# Patient Record
Sex: Female | Born: 1950 | Race: White | Hispanic: No | Marital: Married | Smoking: Never smoker
Health system: Southern US, Community
[De-identification: ages and names within clinical notes are randomized; demographics above are authoritative.]

## PROBLEM LIST (undated history)

## (undated) DIAGNOSIS — R519 Headache, unspecified: Secondary | ICD-10-CM

## (undated) DIAGNOSIS — M1990 Unspecified osteoarthritis, unspecified site: Secondary | ICD-10-CM

## (undated) DIAGNOSIS — I10 Essential (primary) hypertension: Secondary | ICD-10-CM

## (undated) DIAGNOSIS — J189 Pneumonia, unspecified organism: Secondary | ICD-10-CM

## (undated) DIAGNOSIS — N200 Calculus of kidney: Secondary | ICD-10-CM

## (undated) DIAGNOSIS — Z87442 Personal history of urinary calculi: Secondary | ICD-10-CM

## (undated) HISTORY — PX: DILATION AND CURETTAGE OF UTERUS: SHX78

## (undated) HISTORY — PX: OTHER SURGICAL HISTORY: SHX169

## (undated) HISTORY — PX: GANGLION CYST EXCISION: SHX1691

## (undated) HISTORY — PX: TUBAL LIGATION: SHX77

---

## 2015-03-17 DIAGNOSIS — R42 Dizziness and giddiness: Secondary | ICD-10-CM | POA: Insufficient documentation

## 2016-04-02 DIAGNOSIS — Z8619 Personal history of other infectious and parasitic diseases: Secondary | ICD-10-CM | POA: Insufficient documentation

## 2018-08-10 ENCOUNTER — Encounter: Payer: Self-pay | Admitting: Emergency Medicine

## 2018-08-10 ENCOUNTER — Ambulatory Visit
Admission: EM | Admit: 2018-08-10 | Discharge: 2018-08-10 | Disposition: A | Discharge status: Home / Self Care | Payer: Medicare Other | Attending: Urgent Care | Admitting: Urgent Care

## 2018-08-10 ENCOUNTER — Other Ambulatory Visit: Payer: Self-pay

## 2018-08-10 DIAGNOSIS — N200 Calculus of kidney: Secondary | ICD-10-CM

## 2018-08-10 DIAGNOSIS — N390 Urinary tract infection, site not specified: Secondary | ICD-10-CM | POA: Diagnosis not present

## 2018-08-10 HISTORY — DX: Essential (primary) hypertension: I10

## 2018-08-10 LAB — URINALYSIS, COMPLETE (UACMP) WITH MICROSCOPIC
Bacteria, UA: NONE SEEN
Bilirubin Urine: NEGATIVE
Glucose, UA: NEGATIVE mg/dL
Ketones, ur: NEGATIVE mg/dL
Nitrite: NEGATIVE
Specific Gravity, Urine: 1.025 (ref 1.005–1.030)
pH: 5.5 (ref 5.0–8.0)

## 2018-08-10 MED ORDER — OXYCODONE-ACETAMINOPHEN 5-325 MG PO TABS: 1.0000 | ORAL_TABLET | Freq: Three times a day (TID) | ORAL | 0 refills | Status: DC | PRN

## 2018-08-10 MED ORDER — NITROFURANTOIN MONOHYD MACRO 100 MG PO CAPS: 100.0000 mg | ORAL_CAPSULE | Freq: Two times a day (BID) | ORAL | 0 refills | Status: DC

## 2018-08-10 NOTE — ED Provider Notes (Signed)
[place], [place]   Name: Amber CourtConnie Perman DOB: 1950-12-30 MRN: [id number] CSN: [id number] PCP: Patient, No Pcp Per  Arrival date and time:  08/10/18 1102  Chief Complaint:  Back Pain and Abdominal Pain   NOTE: Prior to seeing the patient today, I have reviewed the triage nursing documentation and vital signs. Clinical staff has updated patient's PMH/PSHx, current medication list, and drug allergies/intolerances to ensure comprehensive history available to assist in medical decision making.   History:   HPI: Amber Holmes is a 68 y.o. female who presents today with complaints of pain in her RIGHT lower back with associated dysuria. Symptoms started on Wednesday (08/06/2018). PMH (+) for recurrent urolithiasis. Patient presents to the clinic today with a urine cup that contains multiple stones. Patient advising that she passed a large stone on Thursday morning. Despite passing the stone, patient has had continued pain in her back, as well as dysuria and frequency. She has not appreciated any gross hematuria. She denies any associated fevers. Patient noting that her pain worsens significantly at night and has affected her sleep quality. She has been taking a retained supply of expired Percocet from 2014, which have improved her symptoms. She presented today requesting additional tablets. Of note, patient advising that she was followed by urology in the past, however her provider either retired or left the practice. She has not established care with another local provider for ongoing management of her kidney stone disease.    Past Medical History:  Diagnosis Date  . Hypertension   . Thyroid disease     History reviewed. No pertinent surgical history.  History reviewed. No pertinent family history.  Social History   Tobacco Use  . Smoking status: Never Smoker  . Smokeless tobacco: Never Used  Substance Use Topics  . Alcohol use: Never    Frequency: Never  . Drug use: Never    There are  no active problems to display for this patient.   Home Medications:    Current Meds  Medication Sig  . amLODipine (NORVASC) 5 MG tablet Take 5 mg by mouth daily.  Marland Kitchen. aspirin EC 81 MG tablet Take 81 mg by mouth daily.  . Vitamin D, Ergocalciferol, (DRISDOL) 1.25 MG (50000 UT) CAPS capsule Take 50,000 Units by mouth every 7 (seven) days.  . [DISCONTINUED] oxyCODONE-acetaminophen (PERCOCET/ROXICET) 5-325 MG tablet Take 1 tablet by mouth every 6 (six) hours as needed for severe pain.    Allergies:   Penicillins  Review of Systems (ROS): Review of Systems  Constitutional: Negative for chills and fever.  HENT: Negative.   Respiratory: Negative for cough and shortness of breath.   Cardiovascular: Negative for chest pain and palpitations.  Gastrointestinal: Negative for abdominal pain, diarrhea, nausea and vomiting.  Endocrine:       PMH (+) for HYPOthyroidism - on levothyroxine  Genitourinary: Positive for dysuria, flank pain and frequency. Negative for hematuria, pelvic pain, urgency, vaginal bleeding, vaginal discharge and vaginal pain.  Musculoskeletal: Positive for back pain. Negative for myalgias and neck pain.  Skin: Negative for color change, pallor and rash.  Neurological: Negative for dizziness, syncope, weakness and headaches.     Vital Signs: Today's Vitals   08/10/18 1148 08/10/18 1154 08/10/18 1237  BP:  (!) 167/84   Pulse:  77   Resp:  16   Temp:  98.1 F (36.7 C)   TempSrc:  Oral   SpO2:  97%   Weight: 203 lb (92.1 kg)    Height: 5\' 2"  (1.575 m)  PainSc: 5   0-No pain    Physical Exam: Physical Exam  Constitutional: She is oriented to person, place, and time and well-developed, well-nourished, and in no distress.  HENT:  Head: Normocephalic and atraumatic.  Mouth/Throat: Mucous membranes are normal.  Eyes: Pupils are equal, round, and reactive to light. EOM are normal.  Cardiovascular: Normal rate, regular rhythm, normal heart sounds and intact distal  pulses. Exam reveals no gallop and no friction rub.  No murmur heard. Pulmonary/Chest: Effort normal and breath sounds normal. No respiratory distress. She has no wheezes. She has no rales.  Abdominal: Soft. Normal appearance and bowel sounds are normal. There is no hepatosplenomegaly. There is no abdominal tenderness. There is CVA tenderness (slight on the RIGHT).  Neurological: She is alert and oriented to person, place, and time. Gait normal. GCS score is 15.  Skin: Skin is warm and dry. No rash noted.  Psychiatric: Mood, memory, affect and judgment normal.  Nursing note and vitals reviewed.   Urgent Care Treatments / Results:   LABS: PLEASE NOTE: all labs that were ordered this encounter are listed, however only abnormal results are displayed. Labs Reviewed  URINALYSIS, COMPLETE (UACMP) WITH MICROSCOPIC - Abnormal; Notable for the following components:      Result Value   Hgb urine dipstick MODERATE (*)    Protein, ur TRACE (*)    Leukocytes,Ua TRACE (*)    All other components within normal limits  URINE CULTURE    EKG: -None  RADIOLOGY: No results found.  PROCEDURES: Procedures  MEDICATIONS RECEIVED THIS VISIT: Medications - No data to display  PERTINENT CLINICAL COURSE NOTES/UPDATES:   Initial Impression / Assessment and Plan / Urgent Care Course:  Pertinent labs & imaging results that were available during my care of the patient were personally reviewed by me and considered in my medical decision making (see lab/imaging section of note for values and interpretations).  Amber Holmes is a 68 y.o. female who [facility] with complaints of Back Pain and Abdominal Pain  Patient overall well appearing and in no acute distress today in clinic. Exam reveals slight CVAT on the RIGHT. Presenting symptoms since Wednesday. Patient passed a stone on Thursday morning. No nausea, vomiting, fever, or chills. UA reveals  trace leukocytes and blood; reflex  culture sent. Patient voiding normally. Given symptoms, there is concern for urinary tract infection causing continued symptoms. 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 Will cover patient with a 5 day course of nitrofurantoin. Patient encouraged to complete the entire course of antibiotics even if she begins to feel better. She was advised that if culture demonstrates resistance to the prescribed antibiotic, she will be contacted and advised of the need to change the antibiotic being used to treat her infection. Patient encouraged to increase her fluid intake as much as possible. Discussed that water is always best to flush the urinary tract. She was advised to avoid caffeine containing fluids until her infections clears, as caffeine can cause her to experience painful bladder spasms. Will refill a short supply of Percocet to help with continued pain control.   Patient needs to be seen for further evaluation by urology Name and office contact information provided on today's AVS for Michiel CowboyShannon McGowan, PA. Patient advised the she will need to contact the office to schedule an appointment to be seen.   I have reviewed the follow up and strict  return precautions for any new or worsening symptoms. Patient is aware of symptoms that would be deemed urgent/emergent, and would thus require further evaluation either here or in the emergency department. At the time of discharge, she verbalized understanding and consent with the discharge plan as it was reviewed with her. All questions were fielded by provider and/or clinic staff prior to patient discharge.    Final Clinical Impressions / Urgent Care Diagnoses:   Final diagnoses:  Kidney stones  Urinary tract infection without hematuria, site unspecified    New Prescriptions:  [place] Controlled Substance Registry consulted? Yes, I have consulted the [place] Controlled Substances Registry for this patient, and  feel the risk/benefit ratio today is favorable for proceeding with this prescription for a controlled substance.  Meds ordered this encounter  Medications  . oxyCODONE-acetaminophen (PERCOCET/ROXICET) 5-325 MG tablet    Sig: Take 1 tablet by mouth every 8 (eight) hours as needed for severe pain.    Dispense:  12 tablet    Refill:  0  . nitrofurantoin, macrocrystal-monohydrate, (MACROBID) 100 MG capsule    Sig: Take 1 capsule (100 mg total) by mouth 2 (two) times daily.    Dispense:  10 capsule    Refill:  0   . Discussed use of controlled substance medication to treat her acute pain.  o Reviewed [place] STOP Act regulations regarding the prescription of controlled substances; supply will be limited to no more than 5 days.  o She was made aware that needs beyond the limited supply today will require another clinic visit, or evaluation by her PCP for re-evaluation of condition that may/may not warrant further therapy.  o Advised that this clinic does not refill controlled substances over the phone without face to face evaluation.  . Safety precautions reviewed.  o Patient educated that medications should not be bitten, chewed, or crushed.  o Patient verbalized understanding that medications should not be sold or shared, taken with alcohol, and should not be used while working or driving.  o She has been made aware of the side effects associated with the use of this particular medication. o Patient understands that this medication can cause CNS depression, increase her risk of falls, and even lead to overdose that may result in death, if used outside of the parameters that she and I discussed.  With all of this in mind, she knowingly accepts the risks and responsibilities associated with intended course of treatment, and elects to responsibly proceed as discussed.  Recommended Follow up Care:  Patient encouraged to follow up with the following provider within the specified time frame, or sooner as  dictated by the severity of her symptoms. As always, she was instructed that for any urgent/emergent care needs, she should seek care either here or in the emergency department for more immediate evaluation.  Follow-up Information    Call  Zara Council A, PA-C.   Specialties: Urology, Radiology Why: Need follow up appointment for recurrent kidney stones Contact information: [place] [phone]         NOTE: This note was prepared using Dragon dictation software along with smaller phrase technology. Despite my best ability to proofread, there is the potential that transcriptional errors may still occur from this process, and are completely unintentional.     Karen Kitchens, NP 08/10/18 1825

## 2018-08-10 NOTE — ED Triage Notes (Signed)
Patient states that she pass a kidney stone on Thursday.  Patient reports lower back and abdominal pain since Wed.  Patient denies fevers.

## 2018-08-10 NOTE — Discharge Instructions (Addendum)
It was very nice seeing you today in clinic. Thank you for entrusting me with your care.   As discussed, your urine is POSITIVE for infection. Will approach treatment as follows:  Prescription has been sent to your pharmacy for antibiotics.  Please pick up and take as directed. FINISH the entire course of medication even if you are feeling better.  A culture will be sent on your provided sample. If it comes back resistant to what I have prescribed you, someone will call you and let you know that we will need to change antibiotics. Increase fluid intake as much as possible to flush your urinary tract.  Water is always the best.  Avoid caffeine until your infection clears up, as it can contribute to painful bladder spasms.  May use Azo products (over the counter) to help with pain/discomfort.   Make arrangements to follow up with urology. I have given you ne name of an excellent local provider. Please call them to make an appointment. If your symptoms/condition worsens, please seek follow up care either here or in the ER. Please remember, our [facility] providers are "right here with you" when you need [place].   Again, it was my pleasure to take care of you today. Thank you for choosing our clinic. I hope that you start to feel better quickly.   Honor Loh, MSN, APRN, FNP-C, CEN Advanced Practice Provider [facility]

## 2018-08-11 ENCOUNTER — Other Ambulatory Visit: Payer: Self-pay

## 2018-08-11 ENCOUNTER — Emergency Department: Payer: Medicare Other

## 2018-08-11 ENCOUNTER — Emergency Department
Admission: EM | Admit: 2018-08-11 | Discharge: 2018-08-12 | Disposition: A | Discharge status: Home / Self Care | Payer: Medicare Other | Attending: Emergency Medicine | Admitting: Emergency Medicine

## 2018-08-11 DIAGNOSIS — N200 Calculus of kidney: Secondary | ICD-10-CM | POA: Insufficient documentation

## 2018-08-11 DIAGNOSIS — Z7982 Long term (current) use of aspirin: Secondary | ICD-10-CM | POA: Diagnosis not present

## 2018-08-11 DIAGNOSIS — N201 Calculus of ureter: Secondary | ICD-10-CM | POA: Diagnosis not present

## 2018-08-11 DIAGNOSIS — I10 Essential (primary) hypertension: Secondary | ICD-10-CM | POA: Diagnosis not present

## 2018-08-11 DIAGNOSIS — Z79899 Other long term (current) drug therapy: Secondary | ICD-10-CM | POA: Diagnosis not present

## 2018-08-11 DIAGNOSIS — R1030 Lower abdominal pain, unspecified: Secondary | ICD-10-CM | POA: Diagnosis present

## 2018-08-11 HISTORY — DX: Calculus of kidney: N20.0

## 2018-08-11 LAB — URINALYSIS, COMPLETE (UACMP) WITH MICROSCOPIC
Bacteria, UA: NONE SEEN
Bilirubin Urine: NEGATIVE
Glucose, UA: NEGATIVE mg/dL
Hgb urine dipstick: NEGATIVE
Ketones, ur: 20 mg/dL — AB
Leukocytes,Ua: NEGATIVE
Nitrite: NEGATIVE
Protein, ur: NEGATIVE mg/dL
Specific Gravity, Urine: 1.017 (ref 1.005–1.030)
pH: 6 (ref 5.0–8.0)

## 2018-08-11 LAB — CBC
HCT: 42.2 % (ref 36.0–46.0)
Hemoglobin: 14 g/dL (ref 12.0–15.0)
MCH: 29 pg (ref 26.0–34.0)
MCHC: 33.2 g/dL (ref 30.0–36.0)
MCV: 87.6 fL (ref 80.0–100.0)
Platelets: 167 10*3/uL (ref 150–400)
RBC: 4.82 MIL/uL (ref 3.87–5.11)
RDW: 12.7 % (ref 11.5–15.5)
WBC: 7.1 10*3/uL (ref 4.0–10.5)
nRBC: 0 % (ref 0.0–0.2)

## 2018-08-11 LAB — URINE CULTURE: Culture: <10000 — AB

## 2018-08-11 LAB — BASIC METABOLIC PANEL
Anion gap: 9 (ref 5–15)
BUN: 14 mg/dL (ref 8–23)
CO2: 25 mmol/L (ref 22–32)
Calcium: 9.7 mg/dL (ref 8.9–10.3)
Chloride: 103 mmol/L (ref 98–111)
Creatinine, Ser: 0.86 mg/dL (ref 0.44–1.00)
GFR calc Af Amer: >60 mL/min (ref >60)
GFR calc non Af Amer: >60 mL/min (ref >60)
Glucose, Bld: 120 mg/dL — ABNORMAL HIGH (ref 70–99)
Potassium: 4 mmol/L (ref 3.5–5.1)
Sodium: 137 mmol/L (ref 135–145)

## 2018-08-11 MED ORDER — SODIUM CHLORIDE 0.9 % IV BOLUS
1000.0000 mL | Freq: Once | INTRAVENOUS | Status: AC
  Administered 2018-08-11: 1000 mL via INTRAVENOUS

## 2018-08-11 MED ORDER — MORPHINE SULFATE (PF) 4 MG/ML IV SOLN
4.0000 mg | Freq: Once | INTRAVENOUS | Status: AC
  Administered 2018-08-11: 4 mg via INTRAVENOUS
  Filled 2018-08-11: qty 1

## 2018-08-11 NOTE — ED Triage Notes (Signed)
Pt seen at urgent care yesterday, dx with uti, and kidney stones. Pt given nitrofurantion and oxycodone. Pt says she is still in pain. Pt reports some nausea and not sleeping at night. Pt says she has a hx of kidney stones. Pt reports she passed the stone last week but is still hurting.

## 2018-08-12 ENCOUNTER — Telehealth: Payer: Self-pay | Admitting: Urology

## 2018-08-12 MED ORDER — TAMSULOSIN HCL 0.4 MG PO CAPS: 0.4000 mg | ORAL_CAPSULE | Freq: Every day | ORAL | 0 refills | Status: DC

## 2018-08-12 MED ORDER — ONDANSETRON HCL 4 MG PO TABS: 4.0000 mg | ORAL_TABLET | Freq: Every day | ORAL | 0 refills | Status: DC | PRN

## 2018-08-12 MED ORDER — ONDANSETRON 4 MG PO TBDP
4.0000 mg | ORAL_TABLET | Freq: Once | ORAL | Status: AC
  Administered 2018-08-12: 02:00:00 4 mg via ORAL

## 2018-08-12 MED ORDER — HYDROMORPHONE HCL 1 MG/ML IJ SOLN
1.0000 mg | Freq: Once | INTRAMUSCULAR | Status: AC
  Administered 2018-08-12: 1 mg via INTRAVENOUS
  Filled 2018-08-12: qty 1

## 2018-08-12 MED ORDER — ONDANSETRON HCL 4 MG/2ML IJ SOLN
4.0000 mg | Freq: Once | INTRAMUSCULAR | Status: AC
  Administered 2018-08-12: 4 mg via INTRAVENOUS
  Filled 2018-08-12: qty 2

## 2018-08-12 MED ORDER — HYDROCODONE-ACETAMINOPHEN 5-325 MG PO TABS: 1.0000 | ORAL_TABLET | ORAL | 0 refills | Status: DC | PRN

## 2018-08-12 MED ORDER — ONDANSETRON HCL 4 MG PO TABS: 4.0000 mg | ORAL_TABLET | Freq: Once | ORAL | Status: DC

## 2018-08-12 MED ORDER — ONDANSETRON 4 MG PO TBDP
ORAL_TABLET | ORAL | Status: AC
  Administered 2018-08-12: 4 mg via ORAL
  Filled 2018-08-12: qty 1

## 2018-08-12 NOTE — ED Provider Notes (Signed)
Received in signout from Dr. Archie Balboa.  Patient with evidence of ureterolithiasis with flank pain.  No evidence of sepsis.  Urology was consulted per Dr. Archie Balboa.  Plan was to reassess for pain control.  Patient observed in the ER she is currently pain-free.  Did have some nausea and one episode of vomiting after pain medication.  After that I did recommend further observation and even admission the hospital for pain control for evaluation the patient is declined this requesting discharge home.  She has prescription of pain medication prescribed.  She has given referral to urology.  Discussed signs and symptoms for which the patient should return to the ER.   Merlyn Lot, MD 08/12/18 [phone]

## 2018-08-12 NOTE — ED Notes (Signed)
Pt feels better and wants to go home. EDP aware

## 2018-08-12 NOTE — Telephone Encounter (Signed)
When should patient follow up at the office?

## 2018-08-12 NOTE — Telephone Encounter (Signed)
Pt called and states that she was seen in the ER for kidney stones 08/11/2018 and she seen Dr Bernardo Heater. She states that possible surgery? I couldn't find any notes about that. She wasn't sure about an appt in the office. Please advise.

## 2018-08-13 ENCOUNTER — Encounter: Payer: Self-pay | Admitting: Urology

## 2018-08-13 ENCOUNTER — Ambulatory Visit (INDEPENDENT_AMBULATORY_CARE_PROVIDER_SITE_OTHER): Payer: Medicare Other | Admitting: Urology

## 2018-08-13 ENCOUNTER — Other Ambulatory Visit: Payer: Self-pay | Admitting: Radiology

## 2018-08-13 ENCOUNTER — Other Ambulatory Visit
Admission: RE | Admit: 2018-08-13 | Discharge: 2018-08-13 | Disposition: A | Discharge status: Home / Self Care | Payer: Medicare Other | Source: Ambulatory Visit | Attending: Urology | Admitting: Urology

## 2018-08-13 ENCOUNTER — Telehealth: Payer: Self-pay | Admitting: Urology

## 2018-08-13 ENCOUNTER — Other Ambulatory Visit: Payer: Self-pay

## 2018-08-13 VITALS — BP 133/73 | HR 82 | Ht 62.0 in | Wt 206.7 lb

## 2018-08-13 DIAGNOSIS — N23 Unspecified renal colic: Secondary | ICD-10-CM

## 2018-08-13 DIAGNOSIS — N200 Calculus of kidney: Secondary | ICD-10-CM | POA: Diagnosis not present

## 2018-08-13 DIAGNOSIS — N201 Calculus of ureter: Secondary | ICD-10-CM | POA: Diagnosis not present

## 2018-08-13 DIAGNOSIS — Z1159 Encounter for screening for other viral diseases: Secondary | ICD-10-CM | POA: Diagnosis present

## 2018-08-13 DIAGNOSIS — N132 Hydronephrosis with renal and ureteral calculous obstruction: Secondary | ICD-10-CM

## 2018-08-13 LAB — URINALYSIS, COMPLETE
Bilirubin, UA: NEGATIVE
Glucose, UA: NEGATIVE
Ketones, UA: NEGATIVE
Nitrite, UA: NEGATIVE
Specific Gravity, UA: 1.02 (ref 1.005–1.030)
Urobilinogen, Ur: 0.2 mg/dL (ref 0.2–1.0)
pH, UA: 5.5 (ref 5.0–7.5)

## 2018-08-13 LAB — MICROSCOPIC EXAMINATION
Bacteria, UA: NONE SEEN
Epithelial Cells (non renal): NONE SEEN /hpf (ref 0–10)

## 2018-08-13 LAB — SARS CORONAVIRUS 2 (TAT 6-24 HRS): SARS Coronavirus 2: NEGATIVE

## 2018-08-13 MED ORDER — ONDANSETRON HCL 4 MG PO TABS: 4.0000 mg | ORAL_TABLET | Freq: Three times a day (TID) | ORAL | 0 refills | Status: AC | PRN

## 2018-08-13 NOTE — Progress Notes (Signed)
 08/13/2018 1:06 PM   Amber Holmes 01/08/1951 [id number]  Referring provider: Kafer, Jeffrey, MD [address] [place],  [place] [address]  Chief Complaint  Patient presents with  . Nephrolithiasis    HPI: Amber Holmes is a 68-year-old female who presents for evaluation of right renal colic.  She had onset of right flank pain on 08/06/2018.  She passed a fairly large stone following day however has had persistent pain.  She also complained of urinary frequency, urgency and voiding small amounts.  She has a prior history of recurrent stone disease and has had previous ureteroscopy and shockwave lithotripsy.  Her last stone episode was approximately 5 years ago.  She was seen in [facility] on 08/10/2019 and given pain medication and started on nitrofurantoin for a presumed UTI.  Her urine culture was negative.  She presented to the ED on 7/13 complaining of right flank pain associated with nausea.  She denied fever or chills.  There were noted to follow precipitating, aggravating or alleviating factors.  Intensity was rated severe.  A stone protocol CT of the abdomen and pelvis was performed which showed a 10 x 17 mm right proximal ureteral calculus with moderate to severe hydronephrosis.  There was a 4 mm calculus just distal to the proximal stone and a 5 mm right distal ureteral calculus.  She was also noted to have nonobstructing left and right renal calculi.  Since her ED visit her pain has been intermittent.  She is taking half a Percocet secondary to nausea from a full Percocet tab.  Currently her pain is rated 5/10.   PMH: Past Medical History:  Diagnosis Date  . Hypertension   . Kidney stones     Surgical History: Shockwave lithotripsy Ureteroscopy/stone removal  Home Medications:  Allergies as of 08/13/2018      Reactions   Penicillins Rash      Medication List       Accurate as of August 13, 2018  1:06 PM. If you have any questions, ask your nurse or doctor.         amLODipine 5 MG tablet Commonly known as: NORVASC Take 5 mg by mouth daily.   aspirin EC 81 MG tablet Take 81 mg by mouth daily.   nitrofurantoin (macrocrystal-monohydrate) 100 MG capsule Commonly known as: MACROBID Take 1 capsule (100 mg total) by mouth 2 (two) times daily.   ondansetron 4 MG tablet Commonly known as: Zofran Take 1 tablet (4 mg total) by mouth daily as needed.   oxyCODONE-acetaminophen 5-325 MG tablet Commonly known as: PERCOCET/ROXICET Take 1 tablet by mouth every 8 (eight) hours as needed for severe pain.   tamsulosin 0.4 MG Caps capsule Commonly known as: FLOMAX Take 1 capsule (0.4 mg total) by mouth daily after supper.   Vitamin D (Ergocalciferol) 1.25 MG (50000 UT) Caps capsule Commonly known as: DRISDOL Take 50,000 Units by mouth every 7 (seven) days.       Allergies:  Allergies  Allergen Reactions  . Penicillins Rash    Family History: No family history on file.  Social History:  reports that she has never smoked. She has never used smokeless tobacco. She reports that she does not drink alcohol or use drugs.  ROS: UROLOGY Frequent Urination?: Yes Hard to postpone urination?: Yes Burning/pain with urination?: No Get up at night to urinate?: Yes Leakage of urine?: No Urine stream starts and stops?: No Trouble starting stream?: No Do you have to strain to urinate?: No Blood in   urine?: No Urinary tract infection?: No Sexually transmitted disease?: No Injury to kidneys or bladder?: No Painful intercourse?: No Weak stream?: No Currently pregnant?: No Vaginal bleeding?: No Last menstrual period?: N/A  Gastrointestinal Nausea?: No Vomiting?: No Indigestion/heartburn?: No Diarrhea?: No Constipation?: No  Constitutional Fever: No Night sweats?: No Weight loss?: No Fatigue?: No  Skin Skin rash/lesions?: No Itching?: No  Eyes Blurred vision?: No Double vision?: No  Ears/Nose/Throat Sore throat?: No Sinus problems?:  No  Hematologic/Lymphatic Swollen glands?: No Easy bruising?: No  Cardiovascular Leg swelling?: No Chest pain?: No  Respiratory Cough?: No Shortness of breath?: No  Endocrine Excessive thirst?: No  Musculoskeletal Back pain?: Yes Joint pain?: No  Neurological Headaches?: No Dizziness?: No  Psychologic Depression?: No Anxiety?: No  Physical Exam: BP 133/73 (BP Location: Left Arm, Patient Position: Sitting, Cuff Size: Normal)   Pulse 82   Ht 5\' 2"  (1.575 m)   Wt 206 lb 11.2 oz (93.8 kg)   BMI 37.81 kg/m   Constitutional:  Alert and oriented, No acute distress. HEENT: [place] AT, moist mucus membranes.  Trachea midline, no masses. Cardiovascular: No clubbing, cyanosis, or edema.  RRR Respiratory: Normal respiratory effort, no increased work of breathing.  Clear GI: Abdomen is soft, nontender, nondistended, no abdominal masses GU: No CVA tenderness Lymph: No cervical or inguinal lymphadenopathy. Skin: No rashes, bruises or suspicious lesions. Neurologic: Grossly intact, no focal deficits, moving all 4 extremities. Psychiatric: Normal mood and affect.   Pertinent Imaging: CT of the abdomen and pelvis 08/11/2018 was personally reviewed  Results for orders placed during the hospital encounter of 08/11/18  CT Renal Stone Study   Narrative CLINICAL DATA:  Recent diagnosis of UTI and kidney stones. RIGHT flank pain. History of kidney stones.  EXAM: CT ABDOMEN AND PELVIS WITHOUT CONTRAST  TECHNIQUE: Multidetector CT imaging of the abdomen and pelvis was performed following the standard protocol without IV contrast.  COMPARISON:  None.  FINDINGS: Lower chest: No acute abnormality.  Hepatobiliary: No focal liver abnormality is seen. No gallstones, gallbladder wall thickening, or biliary dilatation.  Pancreas: Partially infiltrated with fat but otherwise unremarkable.  Spleen: Normal in size without focal abnormality.  Adrenals/Urinary Tract: Irregular stone  within the proximal RIGHT ureter, measuring 1.7 x 1 cm in size (craniocaudal by transverse). Separate 4 mm stone fragment slightly lower within the proximal RIGHT ureter.  Associated hydronephrosis of moderate to severe degree with associated perinephric and periureteral inflammation/fluid stranding.  Additional 5 mm stone within the distal RIGHT ureter, just proximal to the RIGHT UVJ.  Several LEFT renal stones, largest measuring 7 mm. No LEFT-sided hydronephrosis. LEFT renal cyst. No LEFT-sided ureteral stone. No bladder calculi. Bladder is decompressed.  Stomach/Bowel: No dilated large or small bowel loops. Scattered diverticulosis of the descending and sigmoid colon but no focal inflammatory change to suggest acute diverticulitis. Appendix is normal. Stomach is unremarkable, decompressed.  Vascular/Lymphatic: No significant vascular findings are present. No enlarged abdominal or pelvic lymph nodes.  Reproductive: Uterus and bilateral adnexa are unremarkable.  Other: No abscess collection seen. No free intraperitoneal air.  Musculoskeletal: No acute or suspicious osseous finding. Degenerative spondylosis of the thoracolumbar spine, mild to moderate in degree.  IMPRESSION: 1. Large irregular stone within the proximal RIGHT ureter, measuring 1.7 x 1 cm (craniocaudal by transverse dimensionsw). Separate 4 mm stone fragment slightly lower within the proximal RIGHT ureter. Associated moderate to severe hydronephrosis with associated perinephric and periureteral inflammation/fluid stranding. 2. Additional 5 mm stone within the distal RIGHT ureter, just  proximal to the RIGHT UVJ, likely contributing to the hydroureter and hydronephrosis. 3. Left nephrolithiasis. 4. Colonic diverticulosis without evidence of acute diverticulitis.   Electronically Signed   By: Bary RichardStan  Maynard M.D.   On: 08/11/2018 16:39     Assessment & Plan:   68 year old female with a 17 mm right  proximal ureteral calculus with renal colic and moderate hydronephrosis.  She also has smaller 4 mm proximal and 5 mm distal ureteral calculus.  She was informed that based on the size of her larger stone she will be unable to pass.  Management options were discussed in detail including shockwave lithotripsy and ureteroscopy.  Feel her present stone burden is too significant for in situ shockwave lithotripsy.  Feel her best option would be ureteroscopy with laser lithotripsy/stone removal.  Due to stone volume she was informed that complete removal may not be accomplished and she may need either staged ureteroscopy or follow-up shockwave lithotripsy for any remaining calculi.  The procedure was discussed in detail including potential risks of bleeding, infection/sepsis, ureteral injury and inability to gain access to the upper ureter.  The need for postoperative ureteral stent was discussed with the possibility of stent symptoms.  She indicated all questions were answered to her satisfaction and desires to proceed.  Rx Zofran was sent to her pharmacy.   Riki AltesScott C , MD  [facility] [address], [address] [place], [place] [address] [phone]

## 2018-08-13 NOTE — Telephone Encounter (Signed)
Patient was given the [facility] Information form below as well as the Instructions for Pre-Admission Testing form & a map of [facility].    [place], [address], [place] [address] Telephone: [phone] Fax: [phone]   Thank you for choosing [facility] for your upcoming surgery!  We are always here to assist in your urological needs.  Please read the following information with specific details for your upcoming appointments related to your surgery. Please contact Leah at [phone] with any questions.  The Name of Your Surgery: Ureteroscopy laser lithotripsy,stone removal and stent placement Your Surgery Date: 08/15/18 Your Surgeon: Dr.Stoiff  Please call Same Day Surgery at [phone] between the hours of 1pm-3pm one day prior to your surgery. They will inform you of the time to arrive at Same Day Surgery which is located on the second floor of the [facility].   Please refer to the attached letter regarding instructions for Pre-Admission Testing. You will receive a call from the [place] office regarding your appointment with them.  The Pre-Admission Testing office is located at [place], on the first floor of the [facility] in [place] (office is to the right as you enter through the [employer] [facility] the [facility]). Please have all medications you are currently taking and your insurance card available.  A COVID-19 test will be required prior to surgery and once test is performed you will need to remain in quarantine until the day of surgery. Patient was advised to have nothing to eat or drink after midnight the night prior to surgery except that she may have only water until 2 hours before surgery with nothing to drink within 2 hours of surgery.  The patient states she currently takes  aspirin 81mg  as needed & was informed to hold medication for 7 days prior to surgery beginning on 08/13/18. Patient states she has not taken aspirin since 08/05/2018. Patient's questions were answered and she expressed understanding of these instructions.

## 2018-08-13 NOTE — H&P (View-Only) (Signed)
08/13/2018 1:06 PM   Amber Holmes 1950-06-27 [id number]  Referring provider: Rhodia AlbrightKafer, Jeffrey, MD [address] [place],  [place] [id number]  Chief Complaint  Patient presents with  . Nephrolithiasis    HPI: Amber Holmes is a 68 year old female who presents for evaluation of right renal colic.  She had onset of right flank pain on 08/06/2018.  She passed a fairly large stone following day however has had persistent pain.  She also complained of urinary frequency, urgency and voiding small amounts.  She has a prior history of recurrent stone disease and has had previous ureteroscopy and shockwave lithotripsy.  Her last stone episode was approximately 5 years ago.  [facility]/2019 and given pain medication and started on nitrofurantoin for a presumed UTI.  Her urine culture was negative.  She presented to the ED on 7/13 complaining of right flank pain associated with nausea.  She denied fever or chills.  There were noted to follow precipitating, aggravating or alleviating factors.  Intensity was rated severe.  A stone protocol CT of the abdomen and pelvis was performed which showed a 10 x 17 mm right proximal ureteral calculus with moderate to severe hydronephrosis.  There was a 4 mm calculus just distal to the proximal stone and a 5 mm right distal ureteral calculus.  She was also noted to have nonobstructing left and right renal calculi.  Since her ED visit her pain has been intermittent.  She is taking half a Percocet secondary to nausea from a full Percocet tab.  Currently her pain is rated 5/10.   PMH: Past Medical History:  Diagnosis Date  . Hypertension   . Kidney stones     Surgical History: Shockwave lithotripsy Ureteroscopy/stone removal  Home Medications:  Allergies as of 08/13/2018      Reactions   Penicillins Rash      Medication List       Accurate as of August 13, 2018  1:06 PM. If you have any questions, ask your nurse or doctor.         amLODipine 5 MG tablet Commonly known as: NORVASC Take 5 mg by mouth daily.   aspirin EC 81 MG tablet Take 81 mg by mouth daily.   nitrofurantoin (macrocrystal-monohydrate) 100 MG capsule Commonly known as: MACROBID Take 1 capsule (100 mg total) by mouth 2 (two) times daily.   ondansetron 4 MG tablet Commonly known as: Zofran Take 1 tablet (4 mg total) by mouth daily as needed.   oxyCODONE-acetaminophen 5-325 MG tablet Commonly known as: PERCOCET/ROXICET Take 1 tablet by mouth every 8 (eight) hours as needed for severe pain.   tamsulosin 0.4 MG Caps capsule Commonly known as: FLOMAX Take 1 capsule (0.4 mg total) by mouth daily after supper.   Vitamin D (Ergocalciferol) 1.25 MG (50000 UT) Caps capsule Commonly known as: DRISDOL Take 50,000 Units by mouth every 7 (seven) days.       Allergies:  Allergies  Allergen Reactions  . Penicillins Rash    Family History: No family history on file.  Social History:  reports that she has never smoked. She has never used smokeless tobacco. She reports that she does not drink alcohol or use drugs.  ROS: UROLOGY Frequent Urination?: Yes Hard to postpone urination?: Yes Burning/pain with urination?: No Get up at night to urinate?: Yes Leakage of urine?: No Urine stream starts and stops?: No Trouble starting stream?: No Do you have to strain to urinate?: No Blood in  urine?: No Urinary tract infection?: No Sexually transmitted disease?: No Injury to kidneys or bladder?: No Painful intercourse?: No Weak stream?: No Currently pregnant?: No Vaginal bleeding?: No Last menstrual period?: N/A  Gastrointestinal Nausea?: No Vomiting?: No Indigestion/heartburn?: No Diarrhea?: No Constipation?: No  Constitutional Fever: No Night sweats?: No Weight loss?: No Fatigue?: No  Skin Skin rash/lesions?: No Itching?: No  Eyes Blurred vision?: No Double vision?: No  Ears/Nose/Throat Sore throat?: No Sinus problems?:  No  Hematologic/Lymphatic Swollen glands?: No Easy bruising?: No  Cardiovascular Leg swelling?: No Chest pain?: No  Respiratory Cough?: No Shortness of breath?: No  Endocrine Excessive thirst?: No  Musculoskeletal Back pain?: Yes Joint pain?: No  Neurological Headaches?: No Dizziness?: No  Psychologic Depression?: No Anxiety?: No  Physical Exam: BP 133/73 (BP Location: Left Arm, Patient Position: Sitting, Cuff Size: Normal)   Pulse 82   Ht 5\' 2"  (1.575 m)   Wt 206 lb 11.2 oz (93.8 kg)   BMI 37.81 kg/m   Constitutional:  Alert and oriented, No acute distress. HEENT: [place] AT, moist mucus membranes.  Trachea midline, no masses. Cardiovascular: No clubbing, cyanosis, or edema.  RRR Respiratory: Normal respiratory effort, no increased work of breathing.  Clear GI: Abdomen is soft, nontender, nondistended, no abdominal masses GU: No CVA tenderness Lymph: No cervical or inguinal lymphadenopathy. Skin: No rashes, bruises or suspicious lesions. Neurologic: Grossly intact, no focal deficits, moving all 4 extremities. Psychiatric: Normal mood and affect.   Pertinent Imaging: CT of the abdomen and pelvis 08/11/2018 was personally reviewed  Results for orders placed during the hospital encounter of 08/11/18  CT Renal Stone Study   Narrative CLINICAL DATA:  Recent diagnosis of UTI and kidney stones. RIGHT flank pain. History of kidney stones.  EXAM: CT ABDOMEN AND PELVIS WITHOUT CONTRAST  TECHNIQUE: Multidetector CT imaging of the abdomen and pelvis was performed following the standard protocol without IV contrast.  COMPARISON:  None.  FINDINGS: Lower chest: No acute abnormality.  Hepatobiliary: No focal liver abnormality is seen. No gallstones, gallbladder wall thickening, or biliary dilatation.  Pancreas: Partially infiltrated with fat but otherwise unremarkable.  Spleen: Normal in size without focal abnormality.  Adrenals/Urinary Tract: Irregular stone  within the proximal RIGHT ureter, measuring 1.7 x 1 cm in size (craniocaudal by transverse). Separate 4 mm stone fragment slightly lower within the proximal RIGHT ureter.  Associated hydronephrosis of moderate to severe degree with associated perinephric and periureteral inflammation/fluid stranding.  Additional 5 mm stone within the distal RIGHT ureter, just proximal to the RIGHT UVJ.  Several LEFT renal stones, largest measuring 7 mm. No LEFT-sided hydronephrosis. LEFT renal cyst. No LEFT-sided ureteral stone. No bladder calculi. Bladder is decompressed.  Stomach/Bowel: No dilated large or small bowel loops. Scattered diverticulosis of the descending and sigmoid colon but no focal inflammatory change to suggest acute diverticulitis. Appendix is normal. Stomach is unremarkable, decompressed.  Vascular/Lymphatic: No significant vascular findings are present. No enlarged abdominal or pelvic lymph nodes.  Reproductive: Uterus and bilateral adnexa are unremarkable.  Other: No abscess collection seen. No free intraperitoneal air.  Musculoskeletal: No acute or suspicious osseous finding. Degenerative spondylosis of the thoracolumbar spine, mild to moderate in degree.  IMPRESSION: 1. Large irregular stone within the proximal RIGHT ureter, measuring 1.7 x 1 cm (craniocaudal by transverse dimensionsw). Separate 4 mm stone fragment slightly lower within the proximal RIGHT ureter. Associated moderate to severe hydronephrosis with associated perinephric and periureteral inflammation/fluid stranding. 2. Additional 5 mm stone within the distal RIGHT ureter, just  proximal to the RIGHT UVJ, likely contributing to the hydroureter and hydronephrosis. 3. Left nephrolithiasis. 4. Colonic diverticulosis without evidence of acute diverticulitis.   Electronically Signed   By: Bary RichardStan  Maynard M.D.   On: 08/11/2018 16:39     Assessment & Plan:   68 year old female with a 17 mm right  proximal ureteral calculus with renal colic and moderate hydronephrosis.  She also has smaller 4 mm proximal and 5 mm distal ureteral calculus.  She was informed that based on the size of her larger stone she will be unable to pass.  Management options were discussed in detail including shockwave lithotripsy and ureteroscopy.  Feel her present stone burden is too significant for in situ shockwave lithotripsy.  Feel her best option would be ureteroscopy with laser lithotripsy/stone removal.  Due to stone volume she was informed that complete removal may not be accomplished and she may need either staged ureteroscopy or follow-up shockwave lithotripsy for any remaining calculi.  The procedure was discussed in detail including potential risks of bleeding, infection/sepsis, ureteral injury and inability to gain access to the upper ureter.  The need for postoperative ureteral stent was discussed with the possibility of stent symptoms.  She indicated all questions were answered to her satisfaction and desires to proceed.  Rx Zofran was sent to her pharmacy.   Riki AltesScott C Dashan Chizmar, MD  [facility] [address], [address] [place], [place] [address] [phone]

## 2018-08-13 NOTE — Addendum Note (Signed)
Addended by: Feliberto Gottron on: 08/13/2018 03:57 PM   Modules accepted: Orders

## 2018-08-14 MED ORDER — LEVOFLOXACIN IN D5W 500 MG/100ML IV SOLN
500.0000 mg | INTRAVENOUS | Status: AC
  Administered 2018-08-15: 13:00:00 500 mg via INTRAVENOUS

## 2018-08-15 ENCOUNTER — Ambulatory Visit: Payer: Medicare Other | Admitting: Anesthesiology

## 2018-08-15 ENCOUNTER — Other Ambulatory Visit: Payer: Self-pay | Admitting: Urology

## 2018-08-15 ENCOUNTER — Encounter: Payer: Self-pay | Admitting: Anesthesiology

## 2018-08-15 ENCOUNTER — Encounter
Admission: RE | Disposition: A | Discharge status: Home / Self Care | Payer: Self-pay | Source: Home / Self Care | Attending: Urology

## 2018-08-15 ENCOUNTER — Ambulatory Visit
Admission: RE | Admit: 2018-08-15 | Discharge: 2018-08-15 | Disposition: A | Discharge status: Home / Self Care | Payer: Medicare Other | Attending: Urology | Admitting: Urology

## 2018-08-15 ENCOUNTER — Other Ambulatory Visit: Payer: Self-pay

## 2018-08-15 DIAGNOSIS — N202 Calculus of kidney with calculus of ureter: Secondary | ICD-10-CM

## 2018-08-15 DIAGNOSIS — Z880 Allergy status to penicillin: Secondary | ICD-10-CM | POA: Insufficient documentation

## 2018-08-15 DIAGNOSIS — N201 Calculus of ureter: Secondary | ICD-10-CM

## 2018-08-15 DIAGNOSIS — Z6837 Body mass index (BMI) 37.0-37.9, adult: Secondary | ICD-10-CM | POA: Diagnosis not present

## 2018-08-15 DIAGNOSIS — E669 Obesity, unspecified: Secondary | ICD-10-CM | POA: Diagnosis not present

## 2018-08-15 DIAGNOSIS — Z87442 Personal history of urinary calculi: Secondary | ICD-10-CM | POA: Diagnosis not present

## 2018-08-15 DIAGNOSIS — I10 Essential (primary) hypertension: Secondary | ICD-10-CM | POA: Insufficient documentation

## 2018-08-15 DIAGNOSIS — N132 Hydronephrosis with renal and ureteral calculous obstruction: Secondary | ICD-10-CM | POA: Diagnosis present

## 2018-08-15 DIAGNOSIS — N200 Calculus of kidney: Secondary | ICD-10-CM

## 2018-08-15 HISTORY — PX: CYSTOSCOPY/URETEROSCOPY/HOLMIUM LASER/STENT PLACEMENT: SHX6546

## 2018-08-15 HISTORY — PX: STONE EXTRACTION WITH BASKET: SHX5318

## 2018-08-15 HISTORY — PX: CYSTOSCOPY W/ RETROGRADES: SHX1426

## 2018-08-15 SURGERY — CYSTOSCOPY/URETEROSCOPY/HOLMIUM LASER/STENT PLACEMENT
Anesthesia: General | Site: Ureter | Laterality: Right

## 2018-08-15 MED ORDER — GLYCOPYRROLATE 0.2 MG/ML IJ SOLN
INTRAMUSCULAR | Status: AC
  Filled 2018-08-15: qty 1

## 2018-08-15 MED ORDER — ONDANSETRON HCL 4 MG/2ML IJ SOLN
INTRAMUSCULAR | Status: AC
  Administered 2018-08-15: 17:00:00 4 mg via INTRAVENOUS
  Filled 2018-08-15: qty 2

## 2018-08-15 MED ORDER — IOHEXOL 180 MG/ML  SOLN
INTRAMUSCULAR | Status: DC | PRN
  Administered 2018-08-15: 20 mL

## 2018-08-15 MED ORDER — SUGAMMADEX SODIUM 200 MG/2ML IV SOLN
INTRAVENOUS | Status: AC
  Filled 2018-08-15: qty 2

## 2018-08-15 MED ORDER — ONDANSETRON HCL 4 MG/2ML IJ SOLN
INTRAMUSCULAR | Status: DC | PRN
  Administered 2018-08-15: 4 mg via INTRAVENOUS

## 2018-08-15 MED ORDER — ACETAMINOPHEN 10 MG/ML IV SOLN
INTRAVENOUS | Status: AC
  Filled 2018-08-15: qty 100

## 2018-08-15 MED ORDER — PHENYLEPHRINE HCL (PRESSORS) 10 MG/ML IV SOLN
INTRAVENOUS | Status: AC
  Filled 2018-08-15: qty 1

## 2018-08-15 MED ORDER — LACTATED RINGERS IV SOLN
INTRAVENOUS | Status: DC | PRN
  Administered 2018-08-15 (×2): via INTRAVENOUS

## 2018-08-15 MED ORDER — SUCCINYLCHOLINE CHLORIDE 20 MG/ML IJ SOLN
INTRAMUSCULAR | Status: AC
  Filled 2018-08-15: qty 1

## 2018-08-15 MED ORDER — FENTANYL CITRATE (PF) 100 MCG/2ML IJ SOLN
INTRAMUSCULAR | Status: AC
  Administered 2018-08-15: 25 ug via INTRAVENOUS
  Filled 2018-08-15: qty 2

## 2018-08-15 MED ORDER — FENTANYL CITRATE (PF) 100 MCG/2ML IJ SOLN
25.0000 ug | INTRAMUSCULAR | Status: DC | PRN
  Administered 2018-08-15 (×3): 25 ug via INTRAVENOUS

## 2018-08-15 MED ORDER — PROPOFOL 10 MG/ML IV BOLUS
INTRAVENOUS | Status: DC | PRN
  Administered 2018-08-15: 100 mg via INTRAVENOUS
  Administered 2018-08-15: 20 mg via INTRAVENOUS
  Administered 2018-08-15 (×2): 30 mg via INTRAVENOUS
  Administered 2018-08-15: 20 mg via INTRAVENOUS

## 2018-08-15 MED ORDER — GLYCOPYRROLATE 0.2 MG/ML IJ SOLN
INTRAMUSCULAR | Status: DC | PRN
  Administered 2018-08-15: 0.2 mg via INTRAVENOUS

## 2018-08-15 MED ORDER — FENTANYL CITRATE (PF) 250 MCG/5ML IJ SOLN
INTRAMUSCULAR | Status: AC
  Filled 2018-08-15: qty 5

## 2018-08-15 MED ORDER — FAMOTIDINE 20 MG PO TABS
20.0000 mg | ORAL_TABLET | Freq: Once | ORAL | Status: AC
  Administered 2018-08-15: 20 mg via ORAL

## 2018-08-15 MED ORDER — HYDROCODONE-ACETAMINOPHEN 5-325 MG PO TABS: 0.5000 | ORAL_TABLET | ORAL | 0 refills | Status: DC | PRN

## 2018-08-15 MED ORDER — ROCURONIUM BROMIDE 50 MG/5ML IV SOLN
INTRAVENOUS | Status: AC
  Filled 2018-08-15: qty 1

## 2018-08-15 MED ORDER — LIDOCAINE HCL (CARDIAC) PF 100 MG/5ML IV SOSY
PREFILLED_SYRINGE | INTRAVENOUS | Status: DC | PRN
  Administered 2018-08-15: 100 mg via INTRAVENOUS

## 2018-08-15 MED ORDER — LEVOFLOXACIN IN D5W 500 MG/100ML IV SOLN
INTRAVENOUS | Status: AC
  Filled 2018-08-15: qty 100

## 2018-08-15 MED ORDER — SUGAMMADEX SODIUM 200 MG/2ML IV SOLN
INTRAVENOUS | Status: DC | PRN
  Administered 2018-08-15: 200 mg via INTRAVENOUS

## 2018-08-15 MED ORDER — FAMOTIDINE 20 MG PO TABS
ORAL_TABLET | ORAL | Status: AC
  Administered 2018-08-15: 20 mg via ORAL
  Filled 2018-08-15: qty 1

## 2018-08-15 MED ORDER — ONDANSETRON HCL 4 MG/2ML IJ SOLN
INTRAMUSCULAR | Status: AC
  Filled 2018-08-15: qty 2

## 2018-08-15 MED ORDER — OXYBUTYNIN CHLORIDE 5 MG PO TABS: ORAL_TABLET | ORAL | 0 refills | Status: DC

## 2018-08-15 MED ORDER — LIDOCAINE HCL (PF) 2 % IJ SOLN
INTRAMUSCULAR | Status: AC
  Filled 2018-08-15: qty 10

## 2018-08-15 MED ORDER — DEXAMETHASONE SODIUM PHOSPHATE 10 MG/ML IJ SOLN
INTRAMUSCULAR | Status: AC
  Filled 2018-08-15: qty 1

## 2018-08-15 MED ORDER — LACTATED RINGERS IV SOLN: INTRAVENOUS | Status: DC

## 2018-08-15 MED ORDER — ROCURONIUM BROMIDE 100 MG/10ML IV SOLN
INTRAVENOUS | Status: DC | PRN
  Administered 2018-08-15: 20 mg via INTRAVENOUS
  Administered 2018-08-15 (×4): 5 mg via INTRAVENOUS

## 2018-08-15 MED ORDER — ONDANSETRON HCL 4 MG/2ML IJ SOLN
4.0000 mg | Freq: Once | INTRAMUSCULAR | Status: AC | PRN
  Administered 2018-08-15: 17:00:00 4 mg via INTRAVENOUS

## 2018-08-15 MED ORDER — DEXAMETHASONE SODIUM PHOSPHATE 10 MG/ML IJ SOLN
INTRAMUSCULAR | Status: DC | PRN
  Administered 2018-08-15: 10 mg via INTRAVENOUS

## 2018-08-15 MED ORDER — FENTANYL CITRATE (PF) 100 MCG/2ML IJ SOLN
INTRAMUSCULAR | Status: DC | PRN
  Administered 2018-08-15 (×5): 50 ug via INTRAVENOUS

## 2018-08-15 MED ORDER — SUCCINYLCHOLINE CHLORIDE 20 MG/ML IJ SOLN
INTRAMUSCULAR | Status: DC | PRN
  Administered 2018-08-15: 80 mg via INTRAVENOUS

## 2018-08-15 MED ORDER — ACETAMINOPHEN 10 MG/ML IV SOLN
INTRAVENOUS | Status: DC | PRN
  Administered 2018-08-15: 1000 mg via INTRAVENOUS

## 2018-08-15 MED ORDER — EPHEDRINE SULFATE 50 MG/ML IJ SOLN
INTRAMUSCULAR | Status: AC
  Filled 2018-08-15: qty 1

## 2018-08-15 SURGICAL SUPPLY — 28 items
BAG DRAIN CYSTO-URO LG1000N (MISCELLANEOUS) ×3 IMPLANT
BASKET ZERO TIP 1.9FR (BASKET) ×4 IMPLANT
BRUSH SCRUB EZ 1% IODOPHOR (MISCELLANEOUS) ×3 IMPLANT
CATH URETL 5X70 OPEN END (CATHETERS) ×2 IMPLANT
CNTNR SPEC 2.5X3XGRAD LEK (MISCELLANEOUS) ×1
CONT SPEC 4OZ STER OR WHT (MISCELLANEOUS) ×2
CONTAINER SPEC 2.5X3XGRAD LEK (MISCELLANEOUS) IMPLANT
DRAPE UTILITY 15X26 TOWEL STRL (DRAPES) ×3 IMPLANT
FIBER LASER LITHO 273 (Laser) ×2 IMPLANT
GLOVE BIO SURGEON STRL SZ8 (GLOVE) ×3 IMPLANT
GOWN STRL REUS W/ TWL LRG LVL3 (GOWN DISPOSABLE) ×1 IMPLANT
GOWN STRL REUS W/ TWL XL LVL3 (GOWN DISPOSABLE) ×1 IMPLANT
GOWN STRL REUS W/TWL LRG LVL3 (GOWN DISPOSABLE) ×2
GOWN STRL REUS W/TWL XL LVL3 (GOWN DISPOSABLE) ×2
GUIDEWIRE STR DUAL SENSOR (WIRE) ×5 IMPLANT
INFUSOR MANOMETER BAG 3000ML (MISCELLANEOUS) ×3 IMPLANT
INTRODUCER DILATOR DOUBLE (INTRODUCER) IMPLANT
KIT TURNOVER CYSTO (KITS) ×3 IMPLANT
PACK CYSTO AR (MISCELLANEOUS) ×3 IMPLANT
SET CYSTO W/LG BORE CLAMP LF (SET/KITS/TRAYS/PACK) ×3 IMPLANT
SHEATH URETERAL 12FRX35CM (MISCELLANEOUS) ×2 IMPLANT
SOL .9 NS 3000ML IRR  AL (IV SOLUTION) ×2
SOL .9 NS 3000ML IRR UROMATIC (IV SOLUTION) ×1 IMPLANT
STENT URET 6FRX24 CONTOUR (STENTS) ×2 IMPLANT
STENT URET 6FRX26 CONTOUR (STENTS) IMPLANT
SURGILUBE 2OZ TUBE FLIPTOP (MISCELLANEOUS) ×3 IMPLANT
VALVE UROSEAL ADJ ENDO (VALVE) ×2 IMPLANT
WATER STERILE IRR 1000ML POUR (IV SOLUTION) ×3 IMPLANT

## 2018-08-15 NOTE — Anesthesia Postprocedure Evaluation (Signed)
Anesthesia Post Note  Patient: Amber Holmes  Procedure(s) Performed: CYSTOSCOPY/URETEROSCOPY/HOLMIUM LASER/STENT PLACEMENT (Right Ureter) CYSTOSCOPY WITH RETROGRADE PYELOGRAM (Right Ureter) STONE EXTRACTION WITH BASKET (Right Ureter)  Patient location during evaluation: PACU Anesthesia Type: General Level of consciousness: awake and alert Pain management: pain level controlled Vital Signs Assessment: post-procedure vital signs reviewed and stable Respiratory status: spontaneous breathing, nonlabored ventilation and respiratory function stable Cardiovascular status: blood pressure returned to baseline and stable Postop Assessment: no apparent nausea or vomiting Anesthetic complications: no     Last Vitals:  Vitals:   08/15/18 1719 08/15/18 1859  BP: (!) 154/70   Pulse:  (P) 68  Resp:  (P) 16  Temp:  (P) 36.6 C  SpO2:  (P) 98%    Last Pain:  Vitals:   08/15/18 1859  TempSrc: (P) Temporal  PainSc:                  Durenda Hurt

## 2018-08-15 NOTE — Anesthesia Preprocedure Evaluation (Addendum)
Anesthesia Evaluation  Patient identified by MRN, date of birth, ID band Patient awake    Reviewed: Allergy & Precautions, NPO status , Patient's Chart, lab work & pertinent test results, reviewed documented beta blocker date and time   Airway Mallampati: III  TM Distance: >3 FB     Dental  (+) Upper Dentures, Lower Dentures   Pulmonary           Cardiovascular hypertension, Pt. on medications      Neuro/Psych    GI/Hepatic   Endo/Other    Renal/GU Renal disease     Musculoskeletal   Abdominal   Peds  Hematology   Anesthesia Other Findings Obese.  Reproductive/Obstetrics                            Anesthesia Physical Anesthesia Plan  ASA: III  Anesthesia Plan: General   Post-op Pain Management:    Induction: Intravenous  PONV Risk Score and Plan:   Airway Management Planned: LMA  Additional Equipment:   Intra-op Plan:   Post-operative Plan:   Informed Consent: I have reviewed the patients History and Physical, chart, labs and discussed the procedure including the risks, benefits and alternatives for the proposed anesthesia with the patient or authorized representative who has indicated his/her understanding and acceptance.       Plan Discussed with: CRNA  Anesthesia Plan Comments:         Anesthesia Quick Evaluation

## 2018-08-15 NOTE — Progress Notes (Signed)
Nauseated   zofran given 

## 2018-08-15 NOTE — Anesthesia Procedure Notes (Signed)
Procedure Name: Intubation Date/Time: 08/15/2018 12:43 PM Performed by: Gayland Curry, CRNA Pre-anesthesia Checklist: Patient identified, Emergency Drugs available, Suction available and Patient being monitored Patient Re-evaluated:Patient Re-evaluated prior to induction Oxygen Delivery Method: Circle system utilized Preoxygenation: Pre-oxygenation with 100% oxygen Induction Type: IV induction Ventilation: Mask ventilation without difficulty Laryngoscope Size: Mac and 3 Grade View: Grade I Tube type: Oral Tube size: 7.0 mm Number of attempts: 1 Placement Confirmation: ETT inserted through vocal cords under direct vision,  positive ETCO2 and breath sounds checked- equal and bilateral Secured at: 20 cm Tube secured with: Tape Dental Injury: Teeth and Oropharynx as per pre-operative assessment  Comments: Initially attempt air-Qsp 3.5, poor seal. Changed to ambu aura straight 4.0, again poor seal. Proceed to intubation as above.

## 2018-08-15 NOTE — Progress Notes (Signed)
Received pain medication upon arrival to pacu   Severe pain  Thrashing about in the bed   Pulling clothes off   Trying to get OOB

## 2018-08-15 NOTE — Anesthesia Post-op Follow-up Note (Signed)
Anesthesia QCDR form completed.        

## 2018-08-15 NOTE — Transfer of Care (Signed)
Immediate Anesthesia Transfer of Care Note  Patient: Amber Holmes  Procedure(s) Performed: Procedure(s): CYSTOSCOPY/URETEROSCOPY/HOLMIUM LASER/STENT PLACEMENT (Right) CYSTOSCOPY WITH RETROGRADE PYELOGRAM (Right) STONE EXTRACTION WITH BASKET (Right)  Patient Location: PACU  Anesthesia Type:General  Level of Consciousness: sedated  Airway & Oxygen Therapy: Patient Spontanous Breathing and Patient connected to face mask oxygen  Post-op Assessment: Report given to RN and Post -op Vital signs reviewed and stable  Post vital signs: Reviewed and stable  Last Vitals:  Vitals:   08/15/18 1141 08/15/18 1554  BP: (!) 172/85 (!) 174/84  Pulse: 75 96  Resp: 16 (!) 22  Temp: 36.9 C   SpO2: 79% 02%    Complications: No apparent anesthesia complications

## 2018-08-15 NOTE — Interval H&P Note (Signed)
History and Physical Interval Note:  08/15/2018 12:17 PM  Amber Holmes  has presented today for surgery, with the diagnosis of right ureteral calculis.  The various methods of treatment have been discussed with the patient and family. After consideration of risks, benefits and other options for treatment, the patient has consented to  Procedure(s): CYSTOSCOPY/URETEROSCOPY/HOLMIUM LASER/STENT PLACEMENT (Right) as a surgical intervention.  The patient's history has been reviewed, patient examined, no change in status, stable for surgery.  I have reviewed the patient's chart and labs.  Questions were answered to the patient's satisfaction.     [place]

## 2018-08-15 NOTE — Discharge Instructions (Addendum)
DISCHARGE INSTRUCTIONS FOR KIDNEY STONE/URETERAL STENT   MEDICATIONS:  1. Resume all your other meds from home.  2.  AZO (over-the-counter) can help with the burning/stinging when you urinate. 3.  Hydrocodone is for moderate/severe pain, Rx was sent to your pharmacy. 4.  Oxybutynin is for bladder discomfort/spasm secondary to the stent.  Rx was sent to your pharmacy.  Tamsulosin will also help stent irritation  ACTIVITY:  1. May resume regular activities in 24 hours. 2. No driving while on narcotic pain medications  3. Drink plenty of water  4. Continue to walk at home - you can still get blood clots when you are at home, so keep active, but don't over do it.  5. May return to work/school tomorrow or when you feel ready   BATHING:  1. You can shower.  SIGNS/SYMPTOMS TO CALL:  Please call us if you have a fever greater than 101.5, uncontrolled nausea/vomiting, uncontrolled pain, dizziness, unable to urinate, excessively bloody urine, chest pain, shortness of breath, leg swelling, leg pain, or any other concerns or questions.   Urinary frequency, urgency and blood in the urine is normal postoperatively.  You can reach [place] at [phone].   FOLLOW-UP:  1. You you will be contacted for an appointment in approximately 2 weeks for stent removal.     AMBULATORY SURGERY  DISCHARGE INSTRUCTIONS   1) The drugs that you were given will stay in your system until tomorrow so for the next 24 hours you should not:  A) Drive an automobile B) Make any legal decisions C) Drink any alcoholic beverage   2) You may resume regular meals tomorrow.  Today it is better to start with liquids and gradually work up to solid foods.  You may eat anything you prefer, but it is better to start with liquids, then soup and crackers, and gradually work up to solid foods.   3) Please notify your doctor immediately if you have any unusual bleeding, trouble breathing, redness and pain at the surgery site,  drainage, fever, or pain not relieved by medication.    4) Additional Instructions:        Please contact your physician with any problems or Same Day Surgery at [phone], Monday through Friday 6 am to 4 pm, or [place] at [facility] number at [phone].

## 2018-08-15 NOTE — Op Note (Signed)
Preoperative diagnosis:  1.  Right proximal ureteral calculi 2.  Right nephrolithiasis  Postoperative diagnosis:  1.  Right proximal ureteral calculi 2.  Right nephrolithiasis  Procedure:  1. Cystoscopy 2. Right ureteroscopy and stone removal 3. Ureteroscopic laser lithotripsy 4. Right ureteral stent placement (6FR) 24 cm 5. Right retrograde pyelography with interpretation  Surgeon: Nicki Reaper C. Holmes, M.D.  Anesthesia: General  Complications: None  Intraoperative findings:  1.  Right retrograde pyelography post procedure showed no filling defects, stone fragments or contrast extravasation  EBL: Minimal  Specimens: 1. Calculus fragments for analysis   Indication: Amber Holmes is a 68 y.o. year old female who recently presented to the ED with right renal colic.  Stone protocol CT was remarkable for a 10 x 17 mm right proximal ureteral calculus with moderate to severe hydronephrosis.  There was also a 4 mm calculus just distal to the proximal stone and a 5 mm right distal ureteral calculus.  She also had right nephrolithiasis with the largest calculus measuring approximately 10 mm.   After reviewing the management options for treatment, the patient elected to proceed with the above surgical procedure(s). We have discussed the potential benefits and risks of the procedure, side effects of the proposed treatment, the likelihood of the patient achieving the goals of the procedure, and any potential problems that might occur during the procedure or recuperation. Informed consent has been obtained.  Description of procedure:  The patient was taken to the operating room and general anesthesia was induced.  The patient was placed in the dorsal lithotomy position, prepped and draped in the usual sterile fashion, and preoperative antibiotics were administered. A preoperative time-out was performed.   A 21 French cystoscope was lubricated and passed per urethra.  Panendoscopy was performed  and the bladder mucosa showed no erythema, solid or papillary lesions.  Attention was directed to the right ureteral orifice and a 0.038 Sensor wire was then advanced up the ureter into the renal pelvis under fluoroscopic guidance.  A 4.5 French semirigid ureteroscope was then passed per urethra and the right ureter was easily engaged.  The 5 mm calculus was defined in the distal ureter.  A 273 m holmium laser fiber was placed to the ureteroscope and at a power setting of 0.2J/20 Hz the calculus was dusted/fragmented. Larger fragments were removed with a 1.9 [place] nitinol basket.  A second sensor wire was placed through the ureteroscope and advanced into the renal pelvis under fluoroscopic guidance.  The semirigid ureteroscope was removed.  A 12/14 French ureteral access sheath was placed over the working wire under fluoroscopic guidance without difficulty just proximal to the ureteral calculus.  A digital flexible ureteroscope was placed through the access sheath and advanced into the proximal ureter without difficulty.   The 4 mm calculus was identified and removed with a 1.9 [place] nitinol basket without difficulty.  There was migration of the 10 x 17 mm calculus back in the renal pelvis.  Several small 2-3 mm calculi were collected in the proximal ureter and removed with the nitinol basket.  The guidewire and inner stylette were placed through the access sheath which was advanced more proximally.  The holmium laser fiber was placed through the ureteroscope and the 10 x 17 mm calculus was was dusted/fragmented at a setting of 0.2 J and frequency of 40 hz.  The 10 mm calyceal calculus was identified and dusted in a similar fashion.  Larger fragments were removed with a 1.9 [place] nitinol basket.  Retrograde  pyelogram was performed and each calyx was sequentially examined under fluoroscopic guidance and no significant size fragments were identified.  The ureteral access sheath and ureteroscope were  removed in tandem and the ureter showed no evidence of injury or perforation.  The wire was then backloaded through the cystoscope and a 6FR/24 cm Contour ureteral stent was advance over the wire using Seldinger technique.  The stent was noted to be well-positioned under fluoroscopy and direct vision.  The bladder was then emptied and the procedure ended.  The patient appeared to tolerate the procedure well and without complications.  After anesthetic reversal the patient was transported to the PACU in stable condition.   Amber AxonScott Stoioff, MD

## 2018-08-16 ENCOUNTER — Encounter: Payer: Self-pay | Admitting: Urology

## 2018-08-19 ENCOUNTER — Telehealth: Payer: Self-pay | Admitting: Urology

## 2018-08-19 NOTE — ED Provider Notes (Signed)
[facility] Emergency Department Provider Note   ____________________________________________   I have reviewed the triage vital signs and the nursing notes.   HISTORY  Chief Complaint Flank Pain   History limited by: Not Limited   HPI Amber Holmes is a 68 y.o. female who presents to the emergency department today because of concern for continued flank pain. The patient states that she has had the pain for roughly one week. There were a couple of days when it did feel better. Went to urgent care yesterday where she was treated for UTI and presumed kidney stone. States that the pain has continued. She denies any fevers.    Records reviewed. Per medical record review patient has a history of HTN, Kidney stones.   Past Medical History:  Diagnosis Date  . Hypertension   . Kidney stones     There are no active problems to display for this patient.   Past Surgical History:  Procedure Laterality Date  . CYSTOSCOPY W/ RETROGRADES Right 08/15/2018   Procedure: CYSTOSCOPY WITH RETROGRADE PYELOGRAM;  Surgeon: Riki AltesStoioff, Scott C, MD;  Location: [facility] ORS;  Service: Urology;  Laterality: Right;  . CYSTOSCOPY/URETEROSCOPY/HOLMIUM LASER/STENT PLACEMENT Right 08/15/2018   Procedure: CYSTOSCOPY/URETEROSCOPY/HOLMIUM LASER/STENT PLACEMENT;  Surgeon: Riki AltesStoioff, Scott C, MD;  Location: [facility] ORS;  Service: Urology;  Laterality: Right;  . kidney stone removal    . STONE EXTRACTION WITH BASKET Right 08/15/2018   Procedure: STONE EXTRACTION WITH BASKET;  Surgeon: Riki AltesStoioff, Scott C, MD;  Location: [facility] ORS;  Service: Urology;  Laterality: Right;    Prior to Admission medications   Medication Sig Start Date End Date Taking? Authorizing Provider  acetaminophen (TYLENOL) 500 MG tablet Take 1,000 mg by mouth every 6 (six) hours as needed for headache.    [provider]  amLODipine (NORVASC) 5 MG tablet Take 5 mg by mouth daily.    [provider]  aspirin EC 81 MG  tablet Take 81 mg by mouth at bedtime.     [provider]  HYDROcodone-acetaminophen (NORCO/VICODIN) 5-325 MG tablet Take 0.5-1 tablets by mouth every 4 (four) hours as needed for moderate pain. 08/15/18   Stoioff, Verna CzechScott C, MD  loratadine (CLARITIN) 10 MG tablet Take 10 mg by mouth at bedtime.    [provider]  Melatonin 10 MG CAPS Take 10 mg by mouth at bedtime as needed (sleep).    [provider]  Menthol, Topical Analgesic, (ICY HOT EX) Apply 1 application topically daily as needed (knee pain).    [provider]  Misc Natural Products (OSTEO BI-FLEX ADV TRIPLE ST) TABS Take 1 tablet by mouth 2 (two) times a day.    [provider]  ondansetron (ZOFRAN) 4 MG tablet Take 1 tablet (4 mg total) by mouth every 8 (eight) hours as needed for nausea. 08/13/18 08/13/19  Riki AltesStoioff, Scott C, MD  oxybutynin (DITROPAN) 5 MG tablet 1 tab tid prn frequency,urgency, bladder spasm 08/15/18   Stoioff, Verna CzechScott C, MD  tamsulosin (FLOMAX) 0.4 MG CAPS capsule Take 1 capsule (0.4 mg total) by mouth daily after supper. 08/12/18   Willy Eddyobinson, Patrick, MD  Vitamin D, Ergocalciferol, (DRISDOL) 1.25 MG (50000 UT) CAPS capsule Take 50,000 Units by mouth every Sunday.     [provider]    Allergies Penicillins  History reviewed. No pertinent family history.  Social History Social History   Tobacco Use  . Smoking status: Never Smoker  . Smokeless tobacco: Never Used  Substance Use Topics  . Alcohol  use: Never    Frequency: Never  . Drug use: Never    Review of Systems Constitutional: No fever/chills Eyes: No visual changes. ENT: No sore throat. Cardiovascular: Denies chest pain. Respiratory: Denies shortness of breath. Gastrointestinal: Positive for right flank pain.  Genitourinary: Negative for dysuria. Musculoskeletal: Negative for back pain. Skin: Negative for rash. Neurological: Negative for headaches, focal weakness or  numbness.  ____________________________________________   PHYSICAL EXAM:  VITAL SIGNS: ED Triage Vitals  Enc Vitals Group     BP 08/11/18 1556 (!) 160/85     Pulse Rate 08/11/18 1556 85     Resp 08/11/18 1556 18     Temp 08/11/18 1556 99.5 F (37.5 C)     Temp Source 08/11/18 1556 Oral     SpO2 08/11/18 1556 99 %     Weight 08/11/18 1558 200 lb (90.7 kg)     Height 08/11/18 1558 5\' 2"  (1.575 m)     Head Circumference --      Peak Flow --      Pain Score 08/11/18 1557 7   Constitutional: Alert and oriented.  Eyes: Conjunctivae are normal.  ENT      Head: Normocephalic and atraumatic.      Nose: No congestion/rhinnorhea.      Mouth/Throat: Mucous membranes are moist.      Neck: No stridor. Hematological/Lymphatic/Immunilogical: No cervical lymphadenopathy. Cardiovascular: Normal rate, regular rhythm.  No murmurs, rubs, or gallops.  Respiratory: Normal respiratory effort without tachypnea nor retractions. Breath sounds are clear and equal bilaterally. No wheezes/rales/rhonchi. Gastrointestinal: Soft and non tender. No rebound. No guarding.  Genitourinary: Deferred Musculoskeletal: Normal range of motion in all extremities. No lower extremity edema. Neurologic:  Normal speech and language. No gross focal neurologic deficits are appreciated.  Skin:  Skin is warm, dry and intact. No rash noted. Psychiatric: Mood and affect are normal. Speech and behavior are normal. Patient exhibits appropriate insight and judgment.  ____________________________________________    LABS (pertinent positives/negatives)  BMP wnl except glu 120 CBC wbc 7.1, hgb 14.0, plt 167 UA ketones 20 otherwise unremarkable  ____________________________________________   EKG  None  ____________________________________________    RADIOLOGY  CT renal Large right proximal ureteral stone 1x1.7 cm. Smaller distal right ureteral  stone.  ____________________________________________   PROCEDURES  Procedures  ____________________________________________   INITIAL IMPRESSION / ASSESSMENT AND PLAN / ED COURSE  Pertinent labs & imaging results that were available during my care of the patient were reviewed by me and considered in my medical decision making (see chart for details).   Patient presented to the ED today because of concern for right sided flank pain. History of kidney stones. Ct today shows multiple right sided ureteral stone with large proximal stone. No indication that urine is infected at this time. Discussed with urology, recommended pain control and follow up in clinic. Discussed findings and plan with patient.  ____________________________________________   FINAL CLINICAL IMPRESSION(S) / ED DIAGNOSES  Final diagnoses:  Ureterolithiasis  Kidney stone     Note: This dictation was prepared with Dragon dictation. Any transcriptional errors that result from this process are unintentional     Nance Pear, MD 08/19/18 [phone]

## 2018-08-19 NOTE — Telephone Encounter (Signed)
-----   Message from Abbie Sons, MD sent at 08/15/2018  4:42 PM EDT ----- Regarding: Stent removal Please schedule cystoscopy with stent removal in approximately 2 weeks.  KUB prior to stent removal

## 2018-08-19 NOTE — Telephone Encounter (Signed)
APP MADE PATIENT IS AWARE 

## 2018-09-01 LAB — CALCULI, WITH PHOTOGRAPH (CLINICAL LAB)
Calcium Oxalate Dihydrate: 10 %
Calcium Oxalate Monohydrate: 70 %
Uric Acid Calculi: 20 %
Weight Calculi: 145 mg

## 2018-09-03 ENCOUNTER — Other Ambulatory Visit: Payer: Self-pay

## 2018-09-03 ENCOUNTER — Ambulatory Visit
Admission: RE | Admit: 2018-09-03 | Discharge: 2018-09-03 | Disposition: A | Discharge status: Home / Self Care | Payer: Medicare Other | Attending: Urology | Admitting: Urology

## 2018-09-03 ENCOUNTER — Ambulatory Visit
Admission: RE | Admit: 2018-09-03 | Discharge: 2018-09-03 | Disposition: A | Discharge status: Home / Self Care | Payer: Medicare Other | Source: Ambulatory Visit | Attending: Urology | Admitting: Urology

## 2018-09-03 DIAGNOSIS — N200 Calculus of kidney: Secondary | ICD-10-CM | POA: Diagnosis present

## 2018-09-04 ENCOUNTER — Ambulatory Visit (INDEPENDENT_AMBULATORY_CARE_PROVIDER_SITE_OTHER): Payer: Medicare Other | Admitting: Urology

## 2018-09-04 ENCOUNTER — Encounter: Payer: Self-pay | Admitting: Urology

## 2018-09-04 VITALS — BP 146/77 | HR 82 | Ht 62.0 in | Wt 199.8 lb

## 2018-09-04 DIAGNOSIS — N200 Calculus of kidney: Secondary | ICD-10-CM

## 2018-09-04 DIAGNOSIS — N201 Calculus of ureter: Secondary | ICD-10-CM

## 2018-09-04 LAB — MICROSCOPIC EXAMINATION: RBC, Urine: >30 /hpf — AB (ref 0–2)

## 2018-09-04 LAB — URINALYSIS, COMPLETE
Bilirubin, UA: NEGATIVE
Glucose, UA: NEGATIVE
Ketones, UA: NEGATIVE
Nitrite, UA: NEGATIVE
Specific Gravity, UA: 1.025 (ref 1.005–1.030)
Urobilinogen, Ur: 0.2 mg/dL (ref 0.2–1.0)
pH, UA: 5 (ref 5.0–7.5)

## 2018-09-04 MED ORDER — LIDOCAINE HCL URETHRAL/MUCOSAL 2 % EX GEL
1.0000 "application " | Freq: Once | CUTANEOUS | Status: AC
  Administered 2018-09-04: 1 via URETHRAL

## 2018-09-04 MED ORDER — CIPROFLOXACIN HCL 500 MG PO TABS
500.0000 mg | ORAL_TABLET | Freq: Once | ORAL | Status: AC
  Administered 2018-09-04: 500 mg via ORAL

## 2018-09-04 NOTE — Progress Notes (Signed)
Indications: Patient is 68 y.o., female who recently underwent ureteroscopic removal of multiple right ureteral calculi and right renal calculi with remaining indwelling JJ ureteral stent.  The patient is presenting today for stent removal.  KUB was reviewed.  There are small calcifications overlying the upper portion of the right renal outline all appear to be small enough to pass.  We did discuss possibility of renal colic from obstructing fragments post stent removal.  Procedure:  Flexible Cystoscopy with stent removal (55374)  Timeout was performed and the correct patient, procedure and participants were identified.    Description:  The patient was prepped and draped in the usual sterile fashion. Flexible cystosopy was performed.  The stent was visualized, grasped, and removed intact without difficulty. The patient tolerated the procedure well.  A single dose of oral antibiotics was given.  Complications:  None  Plan: Follow-up 6 weeks with repeat KUB.  I also recommended proceeding with a metabolic evaluation to include 24-hour urine study and blood work.  Stone analysis was 70% calcium oxalate monohydrate, 10% calcium oxalate dihydrate and 20% uric acid

## 2018-09-04 NOTE — Addendum Note (Signed)
Addended by: Donalee Citrin on: 09/04/2018 09:14 AM   Modules accepted: Orders

## 2018-09-04 NOTE — Patient Instructions (Signed)

## 2018-10-15 ENCOUNTER — Other Ambulatory Visit: Payer: Medicare Other

## 2018-10-15 ENCOUNTER — Other Ambulatory Visit: Payer: Self-pay

## 2018-10-23 ENCOUNTER — Ambulatory Visit: Payer: Medicare Other | Admitting: Urology

## 2018-10-28 ENCOUNTER — Other Ambulatory Visit: Payer: Self-pay

## 2018-10-28 ENCOUNTER — Ambulatory Visit
Admission: RE | Admit: 2018-10-28 | Discharge: 2018-10-28 | Disposition: A | Discharge status: Home / Self Care | Payer: Medicare Other | Source: Ambulatory Visit | Attending: Urology | Admitting: Urology

## 2018-10-28 ENCOUNTER — Ambulatory Visit
Admission: RE | Admit: 2018-10-28 | Discharge: 2018-10-28 | Disposition: A | Discharge status: Home / Self Care | Payer: Medicare Other | Attending: Urology | Admitting: Urology

## 2018-10-28 DIAGNOSIS — N200 Calculus of kidney: Secondary | ICD-10-CM | POA: Insufficient documentation

## 2018-10-29 ENCOUNTER — Encounter: Payer: Self-pay | Admitting: Urology

## 2018-10-29 ENCOUNTER — Ambulatory Visit: Payer: Medicare Other | Admitting: Urology

## 2018-10-29 DIAGNOSIS — N200 Calculus of kidney: Secondary | ICD-10-CM | POA: Diagnosis not present

## 2018-10-29 NOTE — Progress Notes (Signed)
10/29/2018 2:16 PM   Amber Holmes 02-15-1950 [id number]  Referring provider: Rhodia Albright, MD [address] [place],  [place] [address]  Chief Complaint  Patient presents with  . Nephrolithiasis    HPI: 68 y.o. female presents for routine follow-up.  She underwent right ureteroscopy with removal of multiple right ureteral calculi and right renal calculi.  Her ureteral stent was removed in early August 2020.  She had no problems post stent removal.  She present denies flank or abdominal pain.  KUB performed yesterday shows small, bilateral calculi.  The size is overestimated on the radiology report.  Stone analysis was mixed calcium oxalate/uric acid.  24-hour urine study was remarkable for a urine volume low at 1.39 and borderline low urine pH at 5.6.  Urine calcium, oxalate and citrate were all normal.  Blood work was normal.   PMH: Past Medical History:  Diagnosis Date  . Hypertension   . Kidney stones     Surgical History: Past Surgical History:  Procedure Laterality Date  . CYSTOSCOPY W/ RETROGRADES Right 08/15/2018   Procedure: CYSTOSCOPY WITH RETROGRADE PYELOGRAM;  Surgeon: Riki Altes, MD;  Location: [facility] ORS;  Service: Urology;  Laterality: Right;  . CYSTOSCOPY/URETEROSCOPY/HOLMIUM LASER/STENT PLACEMENT Right 08/15/2018   Procedure: CYSTOSCOPY/URETEROSCOPY/HOLMIUM LASER/STENT PLACEMENT;  Surgeon: Riki Altes, MD;  Location: [facility] ORS;  Service: Urology;  Laterality: Right;  . kidney stone removal    . STONE EXTRACTION WITH BASKET Right 08/15/2018   Procedure: STONE EXTRACTION WITH BASKET;  Surgeon: Riki Altes, MD;  Location: [facility] ORS;  Service: Urology;  Laterality: Right;    Home Medications:  Allergies as of 10/29/2018      Reactions   Penicillins Swelling, Rash   Whites in eyes started swelling Did it involve swelling of the face/tongue/throat, SOB, or low BP? Yes Did it involve sudden or severe rash/hives, skin peeling, or any reaction on the inside  of your mouth or nose? No Did you need to seek medical attention at a hospital or doctor's office? Yes When did it last happen?10 Years If all above answers are "NO", may proceed with cephalosporin use.      Medication List       Accurate as of October 29, 2018  2:16 PM. If you have any questions, ask your nurse or doctor.        acetaminophen 500 MG tablet Commonly known as: TYLENOL Take 1,000 mg by mouth every 6 (six) hours as needed for headache.   amLODipine 5 MG tablet Commonly known as: NORVASC Take 5 mg by mouth daily.   aspirin EC 81 MG tablet Take 81 mg by mouth at bedtime.   doxycycline 100 MG tablet Commonly known as: VIBRA-TABS   ICY HOT EX Apply 1 application topically daily as needed (knee pain).   loratadine 10 MG tablet Commonly known as: CLARITIN Take 10 mg by mouth at bedtime.   Melatonin 10 MG Caps Take 10 mg by mouth at bedtime as needed (sleep).   ondansetron 4 MG tablet Commonly known as: Zofran Take 1 tablet (4 mg total) by mouth every 8 (eight) hours as needed for nausea.   Osteo Bi-Flex Adv Triple St Tabs Take 1 tablet by mouth 2 (two) times a day.   Vitamin D (Ergocalciferol) 1.25 MG (50000 UT) Caps capsule Commonly known as: DRISDOL Take 50,000 Units by mouth every Sunday.       Allergies:  Allergies  Allergen Reactions  . Penicillins Swelling and Rash    Whites  in eyes started swelling Did it involve swelling of the face/tongue/throat, SOB, or low BP? Yes Did it involve sudden or severe rash/hives, skin peeling, or any reaction on the inside of your mouth or nose? No Did you need to seek medical attention at a hospital or doctor's office? Yes When did it last happen?10 Years If all above answers are "NO", may proceed with cephalosporin use.     Family History: No family history on file.  Social History:  reports that she has never smoked. She has never used smokeless tobacco. She reports that she does not  drink alcohol or use drugs.  ROS: UROLOGY Frequent Urination?: No Hard to postpone urination?: No Burning/pain with urination?: No Get up at night to urinate?: No Leakage of urine?: No Urine stream starts and stops?: No Trouble starting stream?: No Do you have to strain to urinate?: No Blood in urine?: No Urinary tract infection?: No Sexually transmitted disease?: No Injury to kidneys or bladder?: No Painful intercourse?: No Weak stream?: No Currently pregnant?: No Vaginal bleeding?: No Last menstrual period?: N/A  Gastrointestinal Nausea?: No Vomiting?: No Indigestion/heartburn?: No Diarrhea?: No Constipation?: No  Constitutional Fever: No Night sweats?: No Weight loss?: No Fatigue?: No  Skin Skin rash/lesions?: No Itching?: No  Eyes Blurred vision?: No Double vision?: No  Ears/Nose/Throat Sore throat?: No Sinus problems?: No  Hematologic/Lymphatic Swollen glands?: No Easy bruising?: No  Cardiovascular Leg swelling?: No Chest pain?: No  Respiratory Cough?: No Shortness of breath?: No  Endocrine Excessive thirst?: No  Musculoskeletal Back pain?: No Joint pain?: No  Neurological Headaches?: No Dizziness?: No  Psychologic Depression?: No Anxiety?: No  Physical Exam: BP 132/80 (BP Location: Left Arm, Patient Position: Sitting, Cuff Size: Normal)   Pulse 80   Ht 5\' 2"  (1.575 m)   Wt 206 lb (93.4 kg)   BMI 37.68 kg/m   Constitutional:  Alert and oriented, No acute distress.    Pertinent Imaging: Images were personally reviewed Results for orders placed during the hospital encounter of 10/28/18  Abdomen 1 view (KUB)   Narrative CLINICAL DATA:  Right-sided kidney stones.  Asymptomatic.  EXAM: ABDOMEN - 1 VIEW  COMPARISON:  09/03/2018  FINDINGS: Removal of right ureteric stent. Faint calcifications projecting over the interpolar right kidney, including at up to 6 mm. Inter and lower pole left renal collecting system calculi on  the order of 6 and 7 mm are similar. No calcific densities over the expected course of the ureters. Presumed phleboliths in the pelvis. Non-obstructive bowel gas pattern.  IMPRESSION: Bilateral nephrolithiasis, as before. Removal of right ureteric stent.   Electronically Signed   By: Abigail Miyamoto M.D.   On: 10/28/2018 09:09      Assessment & Plan:    - Bilateral nephrolithiasis 24-hour urine study with low urine volume and borderline low urine pH.  20% of her stone analysis was uric acid.  I recommended increasing water intake to keep urine output at 2-2.5 L/day.  Also recommended starting either potassium citrate or litholyte and she elected the latter.  Follow-up 6 months with KUB.   Abbie Sons, [place] [address], [place], [place] [address] [phone]

## 2018-11-02 ENCOUNTER — Encounter: Payer: Self-pay | Admitting: Urology

## 2018-11-02 DIAGNOSIS — N200 Calculus of kidney: Secondary | ICD-10-CM | POA: Insufficient documentation

## 2018-11-03 ENCOUNTER — Other Ambulatory Visit: Payer: Self-pay | Admitting: Urology

## 2019-04-07 ENCOUNTER — Other Ambulatory Visit: Payer: Self-pay | Admitting: *Deleted

## 2019-04-07 DIAGNOSIS — N200 Calculus of kidney: Secondary | ICD-10-CM

## 2019-05-01 ENCOUNTER — Other Ambulatory Visit: Payer: Self-pay

## 2019-05-01 ENCOUNTER — Ambulatory Visit
Admission: RE | Admit: 2019-05-01 | Discharge: 2019-05-01 | Disposition: A | Discharge status: Home / Self Care | Payer: Medicare Other | Source: Ambulatory Visit | Attending: Urology | Admitting: Urology

## 2019-05-01 ENCOUNTER — Ambulatory Visit
Admission: RE | Admit: 2019-05-01 | Discharge: 2019-05-01 | Disposition: A | Discharge status: Home / Self Care | Payer: Medicare Other | Attending: Urology | Admitting: Urology

## 2019-05-01 ENCOUNTER — Ambulatory Visit: Payer: Medicare Other | Admitting: Urology

## 2019-05-01 DIAGNOSIS — N200 Calculus of kidney: Secondary | ICD-10-CM | POA: Diagnosis present

## 2019-05-03 NOTE — Progress Notes (Signed)
05/04/19 2:05 PM   Amber Holmes 05-12-1950 [id number]  Referring provider: Rhodia Albright, MD [address] [place],  [place] [address]  Chief Complaint  Patient presents with  . Nephrolithiasis    HPI: 69 y.o. female who returns today for a 6 month f/u.   -underwent right ureteroscopy w/ removal of right ureteral calculi and right renal calculi  -ureteral stent removed 08/2018 -stone analysis was mixed calcium oxalate/uric acid  -24-hour urine study remarkable for low urine volume Holmes 1.39 and borderline urine pH Holmes 5.6  -ED visit on 04/18/19 [facility]) c/o of right flank pain onset 3 weeks ago -CT showed 2 stones in ureter  -Pt passed one stone on 04/19/19 and has been asymptomatic since.  Stone size estimated 8 mm  PMH: Past Medical History:  Diagnosis Date  . Hypertension   . Kidney stones     Surgical History: Past Surgical History:  Procedure Laterality Date  . CYSTOSCOPY W/ RETROGRADES Right 08/15/2018   Procedure: CYSTOSCOPY WITH RETROGRADE PYELOGRAM;  Surgeon: Amber Altes, MD;  Location: [facility] ORS;  Service: Urology;  Laterality: Right;  . CYSTOSCOPY/URETEROSCOPY/HOLMIUM LASER/STENT PLACEMENT Right 08/15/2018   Procedure: CYSTOSCOPY/URETEROSCOPY/HOLMIUM LASER/STENT PLACEMENT;  Surgeon: Amber Altes, MD;  Location: [facility] ORS;  Service: Urology;  Laterality: Right;  . kidney stone removal    . STONE EXTRACTION WITH BASKET Right 08/15/2018   Procedure: STONE EXTRACTION WITH BASKET;  Surgeon: Amber Altes, MD;  Location: [facility] OR[facility];  Service: Urology;  Laterality: Right;    Home Medications:  Allergies as of 05/04/2019      Reactions   Penicillins Swelling, Rash   Whites in eyes started swelling Did it involve swelling of the face/tongue/throat, SOB, or low BP? Yes Did it involve sudden or severe rash/hives, skin peeling, or any reaction on the inside of your mouth or nose? No Did you need to seek medical attention Holmes a hospital or doctor's office? Yes When  did it last happen?10 Years If all above answers are "NO", may proceed with cephalosporin use.      Medication List       Accurate as of May 04, 2019  2:05 PM. If you have any questions, ask your nurse or doctor.        acetaminophen 500 MG tablet Commonly known as: TYLENOL Take 1,000 mg by mouth every 6 (six) hours as needed for headache.   amLODipine 5 MG tablet Commonly known as: NORVASC Take 5 mg by mouth daily.   aspirin EC 81 MG tablet Take 81 mg by mouth Holmes bedtime.   doxycycline 100 MG tablet Commonly known as: VIBRA-TABS   ICY HOT EX Apply 1 application topically daily as needed (knee pain).   loratadine 10 MG tablet Commonly known as: CLARITIN Take 10 mg by mouth Holmes bedtime.   Melatonin 10 MG Caps Take 10 mg by mouth Holmes bedtime as needed (sleep).   ondansetron 4 MG tablet Commonly known as: Zofran Take 1 tablet (4 mg total) by mouth every 8 (eight) hours as needed for nausea.   Osteo Bi-Flex Adv Triple St Tabs Take 1 tablet by mouth 2 (two) times a day.   tamsulosin 0.4 MG Caps capsule Commonly known as: FLOMAX Take 0.4 mg by mouth.   Vitamin D (Ergocalciferol) 1.25 MG (50000 UNIT) Caps capsule Commonly known as: DRISDOL Take 50,000 Units by mouth every Sunday.       Allergies:  Allergies  Allergen Reactions  . Penicillins Swelling and Rash  Whites in eyes started swelling Did it involve swelling of the face/tongue/throat, SOB, or low BP? Yes Did it involve sudden or severe rash/hives, skin peeling, or any reaction on the inside of your mouth or nose? No Did you need to seek medical attention Holmes a hospital or doctor's office? Yes When did it last happen?10 Years If all above answers are "NO", may proceed with cephalosporin use.     Family History: No family history on file.  Social History:  reports that she has never smoked. She has never used smokeless tobacco. She reports that she does not drink alcohol or use  drugs.   Physical Exam: BP (!) 142/81   Pulse 92   Ht 5\' 2"  (1.575 m)   Wt 200 lb (90.7 kg)   BMI 36.58 kg/m   Constitutional:  Alert and oriented, No acute distress. HEENT: Amber Holmes, moist mucus membranes.  Trachea midline, no masses. Cardiovascular: No clubbing, cyanosis, or edema. Respiratory: Normal respiratory effort, no increased work of breathing. Skin: No rashes, bruises or suspicious lesions. Neurologic: Grossly intact, no focal deficits, moving all 4 extremities. Psychiatric: Normal mood and affect.  Pertinent Imaging: Results for orders placed during the hospital encounter of 05/01/19  Abdomen 1 view (KUB)   Narrative CLINICAL DATA:  Nephrolithiasis.  EXAM: ABDOMEN - 1 VIEW  COMPARISON:  CT abdomen/pelvis 04/18/2018  FINDINGS: Holmes least three right renal calculi appreciable radiographically, the largest measuring 5 mm. There are least four left renal calculi appreciable radiographically, the largest measuring 10 mm. No definite ureteral calculi are identified, although multiple pelvic phleboliths limit evaluation. Nonobstructive bowel gas pattern. No evidence of acute bony abnormality.  IMPRESSION: Multiple bilateral renal calculi measuring up to 5 mm on the right and 10 mm on the left. No definite ureteral calculi are identified, although multiple pelvic phleboliths limit evaluation.   Electronically Signed   By: Amber Simmering DO   On: 05/01/2019 08:46     I have personally reviewed the images and agree with radiologist interpretation.   Assessment & Plan:    - Right ureteral calculi Asymptomatic since passing stone on 04/19/19 Recent KUB showed multiple bilateral renal calculi measuring up to 5 mm on right and 10 mm on left   CT was reviewed.  The lead calculus is approximately 8 mm.  There was a second smaller calculus measuring 2-3 mm which she may have already passed.  Will obtain a follow-up renal ultrasound to document resolution of her  hydronephrosis.   Amber Holmes, [place] [address], [place], [place] [address] [phone]  I, Amber Holmes, am acting as a scribe for Dr. Nicki Reaper C. Latishia Holmes,  I have reviewed the above documentation for accuracy and completeness, and I agree with the above.   Amber Sons, MD

## 2019-05-04 ENCOUNTER — Telehealth: Payer: Self-pay | Admitting: *Deleted

## 2019-05-04 ENCOUNTER — Ambulatory Visit: Payer: Medicare Other | Admitting: Urology

## 2019-05-04 ENCOUNTER — Encounter: Payer: Self-pay | Admitting: Urology

## 2019-05-04 VITALS — BP 142/81 | HR 92 | Ht 62.0 in | Wt 200.0 lb

## 2019-05-04 DIAGNOSIS — N132 Hydronephrosis with renal and ureteral calculous obstruction: Secondary | ICD-10-CM | POA: Diagnosis not present

## 2019-05-04 DIAGNOSIS — N200 Calculus of kidney: Secondary | ICD-10-CM | POA: Diagnosis not present

## 2019-05-04 NOTE — Telephone Encounter (Signed)
Notified patient as instructed, patient pleased. Discussed follow-up appointments, patient agrees  

## 2019-05-04 NOTE — Telephone Encounter (Signed)
-----   Message from Riki Altes, MD sent at 05/04/2019  2:30 PM EDT ----- Regarding: Ultrasound Please let patient know I reviewed her CT.  The second stone is much smaller than when she passed.  I am going to get a follow-up ultrasound in approximately 1 month to document resolution of her kidney blockage.  Will call her with results.

## 2019-05-04 NOTE — Telephone Encounter (Signed)
-----   Message from Scott C Stoioff, MD sent at 05/04/2019  2:30 PM EDT ----- Regarding: Ultrasound Please let patient know I reviewed her CT.  The second stone is much smaller than when she passed.  I am going to get a follow-up ultrasound in approximately 1 month to document resolution of her kidney blockage.  Will call her with results.  

## 2019-07-27 DIAGNOSIS — M1712 Unilateral primary osteoarthritis, left knee: Secondary | ICD-10-CM | POA: Insufficient documentation

## 2019-07-27 DIAGNOSIS — M170 Bilateral primary osteoarthritis of knee: Secondary | ICD-10-CM | POA: Insufficient documentation

## 2019-11-26 NOTE — Discharge Instructions (Signed)
Instructions after Total Knee Replacement   Amber Holmes, Jr., M.D.     Dept. of Orthopaedics & Sports Medicine  [facility]  [address]  [place], [place]  [address]  Phone: [phone]   Fax: [phone]    DIET: Drink plenty of non-alcoholic fluids. Resume your normal diet. Include foods high in fiber.  ACTIVITY:  You may use crutches or a walker with weight-bearing as tolerated, unless instructed otherwise. You may be weaned off of the walker or crutches by your Physical Therapist.  Do NOT place pillows under the knee. Anything placed under the knee could limit your ability to straighten the knee.   Continue doing gentle exercises. Exercising will reduce the pain and swelling, increase motion, and prevent muscle weakness.   Please continue to use the TED compression stockings for 6 weeks. You may remove the stockings at night, but should reapply them in the morning. Do not drive or operate any equipment until instructed.  WOUND CARE:  Continue to use the PolarCare or ice packs periodically to reduce pain and swelling. You may bathe or shower after the staples are removed at the first office visit following surgery.  MEDICATIONS: You may resume your regular medications. Please take the pain medication as prescribed on the medication. Do not take pain medication on an empty stomach. You have been given a prescription for a blood thinner (Lovenox or Coumadin). Please take the medication as instructed. (NOTE: After completing a 2 week course of Lovenox, take one Enteric-coated aspirin once a day. This along with elevation will help reduce the possibility of phlebitis in your operated leg.) Do not drive or drink alcoholic beverages when taking pain medications.  CALL THE OFFICE FOR: Temperature above 101 degrees Excessive bleeding or drainage on the dressing. Excessive swelling, coldness, or paleness of the toes. Persistent nausea and vomiting.  FOLLOW-UP:  You  should have an appointment to return to the office in 10-14 days after surgery. Arrangements have been made for continuation of Physical Therapy (either home therapy or outpatient therapy).   [facility] Department Directory         [email or web address]       [email or web address]          Cardiology  Appointments: [place] - [phone] [place] - [phone]  Endocrinology  Appointments: [place] - [phone] [place] - [phone]  Gastroenterology  Appointments: [place] - [phone] [place] - [phone]        General Surgery   Appointments: [place] - [phone]  Internal Medicine/Family Medicine  Appointments: [place] - [phone] [place] - [phone] [place] - [phone]  Metabolic and Weigh Loss Surgery  Appointments: [place] - [phone]        Neurology  Appointments: [place] - [phone] [place] - [phone]  Neurosurgery  Appointments: [place] - [phone]  Obstetrics & Gynecology  Appointments: [place] - [phone] [place] - [phone]        Pediatrics  Appointments: [place] - [phone] [place] - [phone]  Physiatry  Appointments: [place] -[phone]  Physical Therapy  Appointments: [place] - [phone] [place] - [phone]        Podiatry  Appointments: [place] - [phone] [place] - [phone]  Pulmonology  Appointments: [place] - [phone]  Rheumatology  Appointments: [place] - [phone]        [place] Location: [facility]  [address] , [place]  [address]  [place] Location: [facility] [address] [place], [place]  [address]  [place] Location: [facility] [address] [place],   [address]    

## 2019-11-27 ENCOUNTER — Other Ambulatory Visit: Payer: Self-pay

## 2019-11-27 ENCOUNTER — Encounter
Admission: RE | Admit: 2019-11-27 | Discharge: 2019-11-27 | Disposition: A | Discharge status: Home / Self Care | Payer: Medicare Other | Source: Ambulatory Visit | Attending: Orthopedic Surgery | Admitting: Orthopedic Surgery

## 2019-11-27 DIAGNOSIS — Z01818 Encounter for other preprocedural examination: Secondary | ICD-10-CM | POA: Insufficient documentation

## 2019-11-27 DIAGNOSIS — Z01810 Encounter for preprocedural cardiovascular examination: Secondary | ICD-10-CM

## 2019-11-27 HISTORY — DX: Personal history of urinary calculi: Z87.442

## 2019-11-27 LAB — CBC
HCT: 40.8 % (ref 36.0–46.0)
Hemoglobin: 13.7 g/dL (ref 12.0–15.0)
MCH: 30.1 pg (ref 26.0–34.0)
MCHC: 33.6 g/dL (ref 30.0–36.0)
MCV: 89.7 fL (ref 80.0–100.0)
Platelets: 158 10*3/uL (ref 150–400)
RBC: 4.55 MIL/uL (ref 3.87–5.11)
RDW: 12.7 % (ref 11.5–15.5)
WBC: 4.4 10*3/uL (ref 4.0–10.5)
nRBC: 0 % (ref 0.0–0.2)

## 2019-11-27 LAB — SURGICAL PCR SCREEN
MRSA, PCR: NEGATIVE
Staphylococcus aureus: NEGATIVE

## 2019-11-27 LAB — URINALYSIS, ROUTINE W REFLEX MICROSCOPIC
Bacteria, UA: NONE SEEN
Bilirubin Urine: NEGATIVE
Glucose, UA: NEGATIVE mg/dL
Ketones, ur: NEGATIVE mg/dL
Leukocytes,Ua: NEGATIVE
Nitrite: NEGATIVE
Protein, ur: NEGATIVE mg/dL
Specific Gravity, Urine: 1.018 (ref 1.005–1.030)
pH: 5 (ref 5.0–8.0)

## 2019-11-27 LAB — TYPE AND SCREEN
ABO/RH(D): O POS
Antibody Screen: NEGATIVE

## 2019-11-27 LAB — COMPREHENSIVE METABOLIC PANEL
ALT: 23 U/L (ref 0–44)
AST: 25 U/L (ref 15–41)
Albumin: 4.5 g/dL (ref 3.5–5.0)
Alkaline Phosphatase: 64 U/L (ref 38–126)
Anion gap: 9 (ref 5–15)
BUN: 17 mg/dL (ref 8–23)
CO2: 27 mmol/L (ref 22–32)
Calcium: 9.8 mg/dL (ref 8.9–10.3)
Chloride: 102 mmol/L (ref 98–111)
Creatinine, Ser: 0.65 mg/dL (ref 0.44–1.00)
GFR, Estimated: >60 mL/min (ref >60)
Glucose, Bld: 98 mg/dL (ref 70–99)
Potassium: 3.5 mmol/L (ref 3.5–5.1)
Sodium: 138 mmol/L (ref 135–145)
Total Bilirubin: 0.6 mg/dL (ref 0.3–1.2)
Total Protein: 7.3 g/dL (ref 6.5–8.1)

## 2019-11-27 LAB — SEDIMENTATION RATE: Sed Rate: 12 mm/hr (ref 0–30)

## 2019-11-27 LAB — APTT: aPTT: 30 seconds (ref 24–36)

## 2019-11-27 LAB — PROTIME-INR
INR: 0.9 (ref 0.8–1.2)
Prothrombin Time: 11.9 seconds (ref 11.4–15.2)

## 2019-11-27 LAB — C-REACTIVE PROTEIN: CRP: 0.6 mg/dL (ref <1.0)

## 2019-11-27 NOTE — Patient Instructions (Addendum)
Your procedure is scheduled on: 12/04/19 Report to [place]. To find out your arrival time please call [phone] between 1PM - 3PM on 12/03/19.  Remember: Instructions that are not followed completely may result in serious medical risk, up to and including death, or upon the discretion of your surgeon and anesthesiologist your surgery may need to be rescheduled.     _X__ 1. Do not eat food after midnight the night before your procedure.                 No gum chewing or hard candies. You may drink clear liquids up to 2 hours                 before you are scheduled to arrive for your surgery- DO not drink clear                 liquids within 2 hours of the start of your surgery.                 Clear Liquids include:  water, apple juice without pulp, clear carbohydrate                 drink such as Clearfast or Gatorade, Black Coffee or Tea (Do not add                 anything to coffee or tea). Diabetics water only  __X__2.  On the morning of surgery brush your teeth with toothpaste and water, you                 may rinse your mouth with mouthwash if you wish.  Do not swallow any              toothpaste of mouthwash.     _X__ 3.  No Alcohol for 24 hours before or after surgery.   _X__ 4.  Do Not Smoke or use e-cigarettes For 24 Hours Prior to Your Surgery.                 Do not use any chewable tobacco products for at least 6 hours prior to                 surgery.  ____  5.  Bring all medications with you on the day of surgery if instructed.   __X__  6.  Notify your doctor if there is any change in your medical condition      (cold, fever, infections).     Do not wear jewelry, make-up, hairpins, clips or nail polish. Do not wear lotions, powders, or perfumes.  Do not shave 48 hours prior to surgery. Men may shave face and neck. Do not bring valuables to the hospital.    [facility] is not responsible for any belongings or  valuables.  Contacts, dentures/partials or body piercings may not be worn into surgery. Bring a case for your contacts, glasses or hearing aids, a denture cup will be supplied. Leave your suitcase in the car. After surgery it may be brought to your room. For patients admitted to the hospital, discharge time is determined by your treatment team.   Patients discharged the day of surgery will not be allowed to drive home.   Please read over the following fact sheets that you were given:   MRSA Information  __X__ Take these medicines the morning of surgery with A SIP OF WATER:  1. amLODipine (NORVASC) 5 MG tablet  2. cetirizine (ZYRTEC) 10 MG tablet  3.   4.  5.  6.  ____ Fleet Enema (as directed)   __X__ Use CHG Soap/SAGE wipes as directed  ____ Use inhalers on the day of surgery  ____ Stop metformin/Janumet/Farxiga 2 days prior to surgery    ____ Take 1/2 of usual insulin dose the night before surgery. No insulin the morning          of surgery.   ____ Stop Blood Thinners Coumadin/Plavix/Xarelto/Pleta/Pradaxa/Eliquis/Effient/Aspirin  on   Or contact your Surgeon, Cardiologist or Medical Doctor regarding  ability to stop your blood thinners  __X__ Stop Anti-inflammatories 7 days before surgery such as Advil, Ibuprofen, Motrin,  BC or Goodies Powder, Naprosyn, Naproxen, Aleve, Aspirin    __X__ Stop all herbal supplements, fish oil or vitamin E until after surgery.  ELDEBERRY, OSTEO BIFLEX  ____ Bring C-Pap to the hospital.

## 2019-11-29 LAB — URINE CULTURE
Culture: NO GROWTH
Special Requests: NORMAL

## 2019-11-30 LAB — IGE: IgE (Immunoglobulin E), Serum: 82 IU/mL (ref 6–495)

## 2019-12-01 ENCOUNTER — Encounter: Payer: Self-pay | Admitting: Orthopedic Surgery

## 2019-12-02 ENCOUNTER — Other Ambulatory Visit: Payer: Self-pay

## 2019-12-02 ENCOUNTER — Other Ambulatory Visit
Admission: RE | Admit: 2019-12-02 | Discharge: 2019-12-02 | Disposition: A | Discharge status: Home / Self Care | Payer: Medicare Other | Source: Ambulatory Visit | Attending: Orthopedic Surgery | Admitting: Orthopedic Surgery

## 2019-12-02 DIAGNOSIS — Z20822 Contact with and (suspected) exposure to covid-19: Secondary | ICD-10-CM | POA: Insufficient documentation

## 2019-12-02 DIAGNOSIS — Z01818 Encounter for other preprocedural examination: Secondary | ICD-10-CM | POA: Insufficient documentation

## 2019-12-02 LAB — SARS CORONAVIRUS 2 (TAT 6-24 HRS): SARS Coronavirus 2: NEGATIVE

## 2019-12-03 ENCOUNTER — Encounter: Payer: Self-pay | Admitting: Orthopedic Surgery

## 2019-12-03 MED ORDER — MAGNESIUM HYDROXIDE 400 MG/5ML PO SUSP
30.0000 mL | Freq: Every day | ORAL | Status: DC
  Administered 2019-12-05: 30 mL via ORAL
  Filled 2019-12-03 (×2): qty 30

## 2019-12-03 MED ORDER — FERROUS SULFATE 325 (65 FE) MG PO TABS
325.0000 mg | ORAL_TABLET | Freq: Two times a day (BID) | ORAL | Status: DC
  Administered 2019-12-04 – 2019-12-06 (×3): 325 mg via ORAL
  Filled 2019-12-03 (×5): qty 1

## 2019-12-03 MED ORDER — ACETAMINOPHEN 325 MG PO TABS: 325.0000 mg | ORAL_TABLET | Freq: Four times a day (QID) | ORAL | Status: DC | PRN

## 2019-12-03 MED ORDER — CELECOXIB 200 MG PO CAPS
200.0000 mg | ORAL_CAPSULE | Freq: Two times a day (BID) | ORAL | Status: DC
  Administered 2019-12-04 – 2019-12-06 (×4): 200 mg via ORAL
  Filled 2019-12-03 (×4): qty 1

## 2019-12-03 MED ORDER — DEXAMETHASONE SODIUM PHOSPHATE 10 MG/ML IJ SOLN: 8.0000 mg | Freq: Once | INTRAMUSCULAR | Status: AC

## 2019-12-03 MED ORDER — SODIUM CHLORIDE 0.9 % IV SOLN: INTRAVENOUS | Status: DC

## 2019-12-03 MED ORDER — ONDANSETRON HCL 4 MG PO TABS: 4.0000 mg | ORAL_TABLET | Freq: Four times a day (QID) | ORAL | Status: DC | PRN

## 2019-12-03 MED ORDER — PHENOL 1.4 % MT LIQD
1.0000 | OROMUCOSAL | Status: DC | PRN
  Filled 2019-12-03: qty 177

## 2019-12-03 MED ORDER — BISACODYL 10 MG RE SUPP
10.0000 mg | Freq: Every day | RECTAL | Status: DC | PRN
  Filled 2019-12-03: qty 1

## 2019-12-03 MED ORDER — GABAPENTIN 300 MG PO CAPS: 300.0000 mg | ORAL_CAPSULE | Freq: Once | ORAL | Status: AC

## 2019-12-03 MED ORDER — SENNOSIDES-DOCUSATE SODIUM 8.6-50 MG PO TABS
1.0000 | ORAL_TABLET | Freq: Two times a day (BID) | ORAL | Status: DC
  Administered 2019-12-04 – 2019-12-06 (×4): 1 via ORAL
  Filled 2019-12-03 (×5): qty 1

## 2019-12-03 MED ORDER — FLEET ENEMA 7-19 GM/118ML RE ENEM: 1.0000 | ENEMA | Freq: Once | RECTAL | Status: DC | PRN

## 2019-12-03 MED ORDER — DIPHENHYDRAMINE HCL 12.5 MG/5ML PO ELIX
12.5000 mg | ORAL_SOLUTION | ORAL | Status: DC | PRN
  Filled 2019-12-03: qty 10

## 2019-12-03 MED ORDER — FAMOTIDINE 20 MG PO TABS: 20.0000 mg | ORAL_TABLET | Freq: Once | ORAL | Status: AC

## 2019-12-03 MED ORDER — CELECOXIB 200 MG PO CAPS: 400.0000 mg | ORAL_CAPSULE | Freq: Once | ORAL | Status: AC

## 2019-12-03 MED ORDER — CHLORHEXIDINE GLUCONATE 4 % EX LIQD
60.0000 mL | Freq: Once | CUTANEOUS | Status: AC
  Administered 2019-12-04: 4 via TOPICAL

## 2019-12-03 MED ORDER — TRAMADOL HCL 50 MG PO TABS
50.0000 mg | ORAL_TABLET | ORAL | Status: DC | PRN
  Administered 2019-12-04: 50 mg via ORAL
  Administered 2019-12-05: 100 mg via ORAL
  Filled 2019-12-03: qty 2
  Filled 2019-12-03: qty 1

## 2019-12-03 MED ORDER — LACTATED RINGERS IV SOLN: INTRAVENOUS | Status: DC

## 2019-12-03 MED ORDER — ONDANSETRON HCL 4 MG/2ML IJ SOLN: 4.0000 mg | Freq: Four times a day (QID) | INTRAMUSCULAR | Status: DC | PRN

## 2019-12-03 MED ORDER — HYDROMORPHONE HCL 1 MG/ML IJ SOLN: 0.5000 mg | INTRAMUSCULAR | Status: DC | PRN

## 2019-12-03 MED ORDER — CHLORHEXIDINE GLUCONATE 0.12 % MT SOLN: 15.0000 mL | Freq: Once | OROMUCOSAL | Status: AC

## 2019-12-03 MED ORDER — GABAPENTIN 300 MG PO CAPS
300.0000 mg | ORAL_CAPSULE | Freq: Every day | ORAL | Status: DC
  Administered 2019-12-04 – 2019-12-05 (×2): 300 mg via ORAL
  Filled 2019-12-03: qty 3
  Filled 2019-12-03: qty 1

## 2019-12-03 MED ORDER — ALUM & MAG HYDROXIDE-SIMETH 200-200-20 MG/5ML PO SUSP: 30.0000 mL | ORAL | Status: DC | PRN

## 2019-12-03 MED ORDER — OXYCODONE HCL 5 MG PO TABS
5.0000 mg | ORAL_TABLET | ORAL | Status: DC | PRN
  Administered 2019-12-05 (×4): 5 mg via ORAL
  Administered 2019-12-06: 10 mg via ORAL
  Administered 2019-12-06: 5 mg via ORAL
  Filled 2019-12-03: qty 2
  Filled 2019-12-03 (×5): qty 1

## 2019-12-03 MED ORDER — METOCLOPRAMIDE HCL 10 MG PO TABS
10.0000 mg | ORAL_TABLET | Freq: Three times a day (TID) | ORAL | Status: AC
  Administered 2019-12-04 – 2019-12-05 (×5): 10 mg via ORAL
  Filled 2019-12-03 (×6): qty 1

## 2019-12-03 MED ORDER — CEFAZOLIN SODIUM-DEXTROSE 2-4 GM/100ML-% IV SOLN
2.0000 g | INTRAVENOUS | Status: AC
  Administered 2019-12-04: 2 g via INTRAVENOUS

## 2019-12-03 MED ORDER — PANTOPRAZOLE SODIUM 40 MG PO TBEC
40.0000 mg | DELAYED_RELEASE_TABLET | Freq: Two times a day (BID) | ORAL | Status: DC
  Administered 2019-12-04 – 2019-12-06 (×4): 40 mg via ORAL
  Filled 2019-12-03 (×5): qty 1

## 2019-12-03 MED ORDER — ORAL CARE MOUTH RINSE: 15.0000 mL | Freq: Once | OROMUCOSAL | Status: AC

## 2019-12-03 MED ORDER — TRANEXAMIC ACID-NACL 1000-0.7 MG/100ML-% IV SOLN: 1000.0000 mg | Freq: Once | INTRAVENOUS | Status: AC

## 2019-12-03 MED ORDER — TRANEXAMIC ACID-NACL 1000-0.7 MG/100ML-% IV SOLN: 1000.0000 mg | INTRAVENOUS | Status: DC

## 2019-12-03 MED ORDER — ACETAMINOPHEN 10 MG/ML IV SOLN
1000.0000 mg | Freq: Four times a day (QID) | INTRAVENOUS | Status: AC
  Administered 2019-12-04 (×2): 1000 mg via INTRAVENOUS
  Filled 2019-12-03 (×2): qty 100

## 2019-12-03 MED ORDER — LORATADINE 10 MG PO TABS
10.0000 mg | ORAL_TABLET | Freq: Every day | ORAL | Status: DC
  Administered 2019-12-05 – 2019-12-06 (×2): 10 mg via ORAL
  Filled 2019-12-03 (×3): qty 1

## 2019-12-03 MED ORDER — AMLODIPINE BESYLATE 5 MG PO TABS
5.0000 mg | ORAL_TABLET | Freq: Every day | ORAL | Status: DC
  Administered 2019-12-05 – 2019-12-06 (×2): 5 mg via ORAL
  Filled 2019-12-03 (×3): qty 1

## 2019-12-03 MED ORDER — MENTHOL 3 MG MT LOZG
1.0000 | LOZENGE | OROMUCOSAL | Status: DC | PRN
  Filled 2019-12-03: qty 9

## 2019-12-03 MED ORDER — CEFAZOLIN SODIUM-DEXTROSE 2-4 GM/100ML-% IV SOLN: 2.0000 g | Freq: Four times a day (QID) | INTRAVENOUS | Status: AC

## 2019-12-03 NOTE — H&P (Signed)
ORTHOPAEDIC HISTORY & PHYSICAL  Progress Notes Amber Maudlin, PA - 12/02/2019 11:00 AM EDT Chief Complaint Chief Complaint  Patient presents with  . Knee Pain  H & P RIGHT KNEE   Reason for Visit LORIBETH KATICH is a 69 y.o. who presents for a history and physical. She is to undergo a right total knee arthroplasty on 12/04/2019. Was last seen in clinic on 07/24/2019. There is been no change in her condition since that time.  She reports a long history of bilateral knee pain with the right knee more symptomatic. She localizes most of the pain along the medial aspect of the knees. She reports some swelling, no locking, and some giving way of the knees. The pain is aggravated by any weight bearing. The patient has not appreciated any significant improvement despite activity modification, glucosamine/chondroitin, Tylenol, and Voltaren gel.The knee pain limits her ability to ambulate long distances. She is not using any ambulatory aids. The patient states that the knee pain has progressed to the point that it is significantly interfering with her activities of daily living.  Past Medical History Past Medical History:  Diagnosis Date  . Hypertension   Past Surgical History History reviewed. No pertinent surgical history.  Past Family History Family History  Problem Relation Age of Onset  . High blood pressure (Hypertension) Mother  . Diabetes type II Sister  . Heart disease Maternal Grandmother  . Stroke Paternal Grandmother   Medications Current Outpatient Medications Ordered in Epic  Medication Sig Dispense Refill  . acetaminophen (TYLENOL) 500 MG tablet Take 1,000 mg by mouth 2 (two) times daily  . amLODIPine (NORVASC) 5 MG tablet 5 mg once daily  . aspirin 81 MG EC tablet Take 81 mg by mouth once daily  . cetirizine (ZYRTEC) 10 MG tablet Take 10 mg by mouth once daily  . cholecalciferol (CHOLECALCIFEROL) 1000 unit tablet Take 1,000 Units by mouth once daily  . diclofenac  (VOLTAREN) 1 % topical gel Apply 2 g topically 2 (two) times daily  . ELDERBERRY FRUIT ORAL Take 2 tablets by mouth once daily  . glucosam/chon-msm1/C/mang/bosw (OSTEO BI-FLEX TRIPLE STRENGTH ORAL) Take 1 tablet by mouth 2 (two) times daily  . ibuprofen 200 mg Cap Take 400 mg by mouth once daily   No current Epic-ordered facility-administered medications on file.   Allergies Allergies  Allergen Reactions  . Penicillins Swelling and Rash  Swelling of eye    Review of Systems A comprehensive 14 point ROS was performed, reviewed, and the pertinent orthopaedic findings are documented in the HPI.  Exam BP 140/80  Ht 157.5 cm (5\' 2" )  Wt 95.1 kg (209 lb 9.6 oz)  BMI 38.34 kg/m   General: Well-developed well-nourished female seen in no acute distress.   HEENT: Atraumatic,normocephalic. Pupils are equal and reactive to light. Oropharynx is clear with moist mucosa  Lungs: Clear to auscultation bilaterally   Cardiovascular: Regular rate and rhythm. Normal S1, S2. No murmurs. No appreciable gallops or rubs. Peripheral pulses are palpable.  Abdomen: Soft, non-tender, nondistended. Bowel sounds present  Extremity:  Right Knee: Soft tissue swelling: mild Effusion: minimal Erythema: none Crepitance: mild Tenderness: medial Alignment: relative varus Mediolateral laxity: medial pseudolaxity Posterior sag: negative Patellar tracking: Good tracking without evidence of subluxation or tilt Atrophy: No significant atrophy.  Quadriceps tone was fair to good. Range of motion: 0/0/112 degrees  Neurological:  The patient is alert and oriented Sensation to light touch appears to be intact and within normal limits Gross motor  strength appeared to be equal to 5/5  Vascular :  Peripheral pulses felt to be palpable. Capillary refill appears to be intact and within normal limits Mild swelling or edema to the lower extremities  X-ray  1. AP standing, lateral and sunrise view of  the right knee ordered and interpreted on today's visit shows bone-on-bone to the medial compartment space with subchondral sclerosis been noted as well as osteophyte formation. Patella appears to be tracking well.  Impression  1. Degenerative arthrosis right knee  Plan   1. Patient is already discontinued her anticoagulation medication. 2. Patient will be spending the night postop 3. Patient is planning going home following surgery 4. Return to clinic 2 weeks postop 5. Patient had no additional questions about the surgery  This note was generated in part with voice recognition software and I apologize for any typographical errors that were not detected and corrected   Tera Partridge PA  Electronically signed by Amber Maudlin, PA at 12/02/2019 11:55 AM EDT

## 2019-12-04 ENCOUNTER — Encounter
Admission: RE | Disposition: A | Discharge status: Home Health | Payer: Self-pay | Source: Home / Self Care | Attending: Orthopedic Surgery

## 2019-12-04 ENCOUNTER — Encounter: Payer: Self-pay | Admitting: Orthopedic Surgery

## 2019-12-04 ENCOUNTER — Inpatient Hospital Stay: Payer: Medicare Other

## 2019-12-04 ENCOUNTER — Inpatient Hospital Stay
Admission: RE | Admit: 2019-12-04 | Discharge: 2019-12-06 | DRG: 470 | Disposition: A | Discharge status: Home Health | Payer: Medicare Other | Attending: Orthopedic Surgery | Admitting: Orthopedic Surgery

## 2019-12-04 ENCOUNTER — Inpatient Hospital Stay: Payer: Medicare Other | Admitting: Registered Nurse

## 2019-12-04 ENCOUNTER — Other Ambulatory Visit: Payer: Self-pay

## 2019-12-04 DIAGNOSIS — M25761 Osteophyte, right knee: Secondary | ICD-10-CM | POA: Diagnosis present

## 2019-12-04 DIAGNOSIS — I10 Essential (primary) hypertension: Secondary | ICD-10-CM | POA: Diagnosis present

## 2019-12-04 DIAGNOSIS — Z8249 Family history of ischemic heart disease and other diseases of the circulatory system: Secondary | ICD-10-CM

## 2019-12-04 DIAGNOSIS — Z96651 Presence of right artificial knee joint: Secondary | ICD-10-CM

## 2019-12-04 DIAGNOSIS — Z20822 Contact with and (suspected) exposure to covid-19: Secondary | ICD-10-CM | POA: Diagnosis present

## 2019-12-04 DIAGNOSIS — Z880 Allergy status to penicillin: Secondary | ICD-10-CM | POA: Diagnosis not present

## 2019-12-04 DIAGNOSIS — Z87442 Personal history of urinary calculi: Secondary | ICD-10-CM

## 2019-12-04 DIAGNOSIS — M1711 Unilateral primary osteoarthritis, right knee: Principal | ICD-10-CM | POA: Diagnosis present

## 2019-12-04 DIAGNOSIS — K08109 Complete loss of teeth, unspecified cause, unspecified class: Secondary | ICD-10-CM | POA: Diagnosis present

## 2019-12-04 DIAGNOSIS — Z96659 Presence of unspecified artificial knee joint: Secondary | ICD-10-CM

## 2019-12-04 HISTORY — PX: KNEE ARTHROPLASTY: SHX992

## 2019-12-04 LAB — ABO/RH: ABO/RH(D): O POS

## 2019-12-04 SURGERY — ARTHROPLASTY, KNEE, TOTAL, USING IMAGELESS COMPUTER-ASSISTED NAVIGATION
Anesthesia: Choice | Site: Knee | Laterality: Right

## 2019-12-04 MED ORDER — ENOXAPARIN SODIUM 30 MG/0.3ML ~~LOC~~ SOLN
30.0000 mg | Freq: Two times a day (BID) | SUBCUTANEOUS | Status: DC
  Administered 2019-12-05 – 2019-12-06 (×4): 30 mg via SUBCUTANEOUS
  Filled 2019-12-04 (×4): qty 0.3

## 2019-12-04 MED ORDER — DEXMEDETOMIDINE HCL 200 MCG/2ML IV SOLN
INTRAVENOUS | Status: DC | PRN
  Administered 2019-12-04: 12 ug via INTRAVENOUS

## 2019-12-04 MED ORDER — DEXAMETHASONE SODIUM PHOSPHATE 10 MG/ML IJ SOLN
INTRAMUSCULAR | Status: AC
  Administered 2019-12-04: 8 mg via INTRAVENOUS
  Filled 2019-12-04: qty 1

## 2019-12-04 MED ORDER — DEXAMETHASONE SODIUM PHOSPHATE 10 MG/ML IJ SOLN
INTRAMUSCULAR | Status: AC
  Filled 2019-12-04: qty 1

## 2019-12-04 MED ORDER — BUPIVACAINE HCL (PF) 0.25 % IJ SOLN
INTRAMUSCULAR | Status: DC | PRN
  Administered 2019-12-04: 60 mL

## 2019-12-04 MED ORDER — CHLORHEXIDINE GLUCONATE 0.12 % MT SOLN
OROMUCOSAL | Status: AC
  Administered 2019-12-04: 15 mL via OROMUCOSAL
  Filled 2019-12-04: qty 15

## 2019-12-04 MED ORDER — TRANEXAMIC ACID-NACL 1000-0.7 MG/100ML-% IV SOLN
INTRAVENOUS | Status: AC
  Filled 2019-12-04: qty 100

## 2019-12-04 MED ORDER — SURGIPHOR WOUND IRRIGATION SYSTEM - OPTIME
TOPICAL | Status: DC | PRN
  Administered 2019-12-04: 500 mL

## 2019-12-04 MED ORDER — TRANEXAMIC ACID-NACL 1000-0.7 MG/100ML-% IV SOLN
INTRAVENOUS | Status: DC | PRN
  Administered 2019-12-04: 1000 mg via INTRAVENOUS

## 2019-12-04 MED ORDER — DEXAMETHASONE SODIUM PHOSPHATE 10 MG/ML IJ SOLN
INTRAMUSCULAR | Status: DC | PRN
  Administered 2019-12-04: 5 mg via INTRAVENOUS

## 2019-12-04 MED ORDER — GLYCOPYRROLATE 0.2 MG/ML IJ SOLN
INTRAMUSCULAR | Status: AC
  Filled 2019-12-04: qty 1

## 2019-12-04 MED ORDER — KETAMINE HCL 50 MG/ML IJ SOLN
INTRAMUSCULAR | Status: AC
  Filled 2019-12-04: qty 10

## 2019-12-04 MED ORDER — SODIUM CHLORIDE 0.9 % IV SOLN
INTRAVENOUS | Status: DC | PRN
  Administered 2019-12-04: 60 mL

## 2019-12-04 MED ORDER — TRANEXAMIC ACID-NACL 1000-0.7 MG/100ML-% IV SOLN
INTRAVENOUS | Status: AC
  Administered 2019-12-04: 1000 mg via INTRAVENOUS
  Filled 2019-12-04: qty 100

## 2019-12-04 MED ORDER — SODIUM CHLORIDE (PF) 0.9 % IJ SOLN
INTRAMUSCULAR | Status: AC
  Filled 2019-12-04: qty 10

## 2019-12-04 MED ORDER — KETAMINE HCL 50 MG/ML IJ SOLN
INTRAMUSCULAR | Status: DC | PRN
  Administered 2019-12-04 (×2): 10 mg via INTRAMUSCULAR
  Administered 2019-12-04: 20 mg via INTRAMUSCULAR

## 2019-12-04 MED ORDER — PROPOFOL 10 MG/ML IV BOLUS
INTRAVENOUS | Status: DC | PRN
  Administered 2019-12-04: 10 mg via INTRAVENOUS
  Administered 2019-12-04: 30 mg via INTRAVENOUS
  Administered 2019-12-04: 20 mg via INTRAVENOUS
  Administered 2019-12-04: 30 mg via INTRAVENOUS

## 2019-12-04 MED ORDER — GABAPENTIN 300 MG PO CAPS
ORAL_CAPSULE | ORAL | Status: AC
  Administered 2019-12-04: 300 mg via ORAL
  Filled 2019-12-04: qty 1

## 2019-12-04 MED ORDER — PROPOFOL 500 MG/50ML IV EMUL
INTRAVENOUS | Status: AC
  Filled 2019-12-04: qty 50

## 2019-12-04 MED ORDER — PROPOFOL 500 MG/50ML IV EMUL
INTRAVENOUS | Status: AC
  Filled 2019-12-04: qty 100

## 2019-12-04 MED ORDER — DEXMEDETOMIDINE (PRECEDEX) IN NS 20 MCG/5ML (4 MCG/ML) IV SYRINGE
PREFILLED_SYRINGE | INTRAVENOUS | Status: AC
  Filled 2019-12-04: qty 5

## 2019-12-04 MED ORDER — SODIUM CHLORIDE 0.9 % IR SOLN
Status: DC | PRN
  Administered 2019-12-04: 500 mL

## 2019-12-04 MED ORDER — FENTANYL CITRATE (PF) 100 MCG/2ML IJ SOLN
25.0000 ug | INTRAMUSCULAR | Status: DC | PRN
  Administered 2019-12-04 (×2): 25 ug via INTRAVENOUS

## 2019-12-04 MED ORDER — ONDANSETRON HCL 4 MG/2ML IJ SOLN: 4.0000 mg | Freq: Once | INTRAMUSCULAR | Status: DC | PRN

## 2019-12-04 MED ORDER — FAMOTIDINE 20 MG PO TABS
ORAL_TABLET | ORAL | Status: AC
  Administered 2019-12-04: 20 mg via ORAL
  Filled 2019-12-04: qty 1

## 2019-12-04 MED ORDER — BUPIVACAINE HCL (PF) 0.5 % IJ SOLN
INTRAMUSCULAR | Status: DC | PRN
  Administered 2019-12-04: 2.8 mL

## 2019-12-04 MED ORDER — GLYCOPYRROLATE 0.2 MG/ML IJ SOLN
INTRAMUSCULAR | Status: DC | PRN
  Administered 2019-12-04: .2 mg via INTRAVENOUS

## 2019-12-04 MED ORDER — BUPIVACAINE HCL (PF) 0.5 % IJ SOLN
INTRAMUSCULAR | Status: AC
  Filled 2019-12-04: qty 10

## 2019-12-04 MED ORDER — ACETAMINOPHEN 10 MG/ML IV SOLN: 1000.0000 mg | Freq: Once | INTRAVENOUS | Status: DC | PRN

## 2019-12-04 MED ORDER — LIDOCAINE HCL 1 % IJ SOLN
INTRAMUSCULAR | Status: DC | PRN
  Administered 2019-12-04: 2 mL via INTRADERMAL

## 2019-12-04 MED ORDER — FENTANYL CITRATE (PF) 100 MCG/2ML IJ SOLN
INTRAMUSCULAR | Status: AC
  Administered 2019-12-04: 25 ug via INTRAVENOUS
  Filled 2019-12-04: qty 2

## 2019-12-04 MED ORDER — ENSURE PRE-SURGERY PO LIQD
296.0000 mL | Freq: Once | ORAL | Status: DC
  Filled 2019-12-04: qty 296

## 2019-12-04 MED ORDER — PHENYLEPHRINE HCL (PRESSORS) 10 MG/ML IV SOLN
INTRAVENOUS | Status: AC
  Filled 2019-12-04: qty 1

## 2019-12-04 MED ORDER — PROPOFOL 500 MG/50ML IV EMUL
INTRAVENOUS | Status: DC | PRN
  Administered 2019-12-04: 75 ug/kg/min via INTRAVENOUS

## 2019-12-04 MED ORDER — LIDOCAINE HCL (CARDIAC) PF 50 MG/5ML IV SOSY: PREFILLED_SYRINGE | INTRAVENOUS | Status: DC | PRN

## 2019-12-04 MED ORDER — CELECOXIB 200 MG PO CAPS
ORAL_CAPSULE | ORAL | Status: AC
  Administered 2019-12-04: 400 mg via ORAL
  Filled 2019-12-04: qty 2

## 2019-12-04 MED ORDER — CEFAZOLIN SODIUM-DEXTROSE 2-4 GM/100ML-% IV SOLN
INTRAVENOUS | Status: AC
  Filled 2019-12-04: qty 100

## 2019-12-04 MED ORDER — ACETAMINOPHEN 10 MG/ML IV SOLN
INTRAVENOUS | Status: DC | PRN
  Administered 2019-12-04: 1000 mg via INTRAVENOUS

## 2019-12-04 MED ORDER — ACETAMINOPHEN 10 MG/ML IV SOLN
INTRAVENOUS | Status: AC
  Filled 2019-12-04: qty 100

## 2019-12-04 SURGICAL SUPPLY — 76 items
ATTUNE PSFEM RTSZ5 NARCEM KNEE (Femur) ×2 IMPLANT
ATTUNE PSRP INSR SZ5 5 KNEE (Insert) ×1 IMPLANT
ATTUNE PSRP INSR SZ5 5MM KNEE (Insert) ×1 IMPLANT
BASEPLATE TIBIAL ROTATING SZ 4 (Knees) ×2 IMPLANT
BATTERY INSTRU NAVIGATION (MISCELLANEOUS) ×12 IMPLANT
BLADE SAW 70X12.5 (BLADE) ×3 IMPLANT
BLADE SAW 90X13X1.19 OSCILLAT (BLADE) ×3 IMPLANT
BLADE SAW 90X25X1.19 OSCILLAT (BLADE) ×3 IMPLANT
CANISTER PREVENA PLUS 150 (CANNISTER) ×3 IMPLANT
CANISTER SUCT 3000ML PPV (MISCELLANEOUS) ×3 IMPLANT
CEMENT HV SMART SET (Cement) ×4 IMPLANT
COOLER POLAR GLACIER W/PUMP (MISCELLANEOUS) ×3 IMPLANT
COVER WAND RF STERILE (DRAPES) ×3 IMPLANT
CUFF TOURN SGL QUICK 24 (TOURNIQUET CUFF)
CUFF TOURN SGL QUICK 30 (TOURNIQUET CUFF)
CUFF TRNQT CYL 24X4X16.5-23 (TOURNIQUET CUFF) IMPLANT
CUFF TRNQT CYL 30X4X21-28X (TOURNIQUET CUFF) IMPLANT
DRAPE 3/4 80X56 (DRAPES) ×3 IMPLANT
DRSG DERMACEA 8X12 NADH (GAUZE/BANDAGES/DRESSINGS) ×3 IMPLANT
DRSG MEPILEX SACRM 8.7X9.8 (GAUZE/BANDAGES/DRESSINGS) ×3 IMPLANT
DRSG OPSITE POSTOP 4X14 (GAUZE/BANDAGES/DRESSINGS) ×3 IMPLANT
DRSG TEGADERM 4X4.75 (GAUZE/BANDAGES/DRESSINGS) ×3 IMPLANT
DURAPREP 26ML APPLICATOR (WOUND CARE) ×6 IMPLANT
ELECT REM PT RETURN 9FT ADLT (ELECTROSURGICAL) ×3
ELECTRODE REM PT RTRN 9FT ADLT (ELECTROSURGICAL) ×1 IMPLANT
EX-PIN ORTHOLOCK NAV 4X150 (PIN) ×6 IMPLANT
GLOVE BIO SURGEON STRL SZ7.5 (GLOVE) ×6 IMPLANT
GLOVE BIOGEL M STRL SZ7.5 (GLOVE) ×6 IMPLANT
GLOVE BIOGEL PI IND STRL 7.5 (GLOVE) ×1 IMPLANT
GLOVE BIOGEL PI INDICATOR 7.5 (GLOVE) ×2
GLOVE INDICATOR 8.0 STRL GRN (GLOVE) ×3 IMPLANT
GOWN STRL REUS W/ TWL LRG LVL3 (GOWN DISPOSABLE) ×2 IMPLANT
GOWN STRL REUS W/ TWL XL LVL3 (GOWN DISPOSABLE) ×1 IMPLANT
GOWN STRL REUS W/TWL LRG LVL3 (GOWN DISPOSABLE) ×4
GOWN STRL REUS W/TWL XL LVL3 (GOWN DISPOSABLE) ×2
HEMOVAC 400CC 10FR (MISCELLANEOUS) ×3 IMPLANT
HOLDER FOLEY CATH W/STRAP (MISCELLANEOUS) ×3 IMPLANT
HOOD PEEL AWAY FLYTE STAYCOOL (MISCELLANEOUS) ×6 IMPLANT
IRRIGATION SURGIPHOR STRL (IV SOLUTION) ×3 IMPLANT
KIT PREVENA INCISION MGT20CM45 (CANNISTER) ×3 IMPLANT
KIT PUMP PREVENA PLUS 14DAY (MISCELLANEOUS) ×3 IMPLANT
KIT TURNOVER KIT A (KITS) ×3 IMPLANT
KNIFE SCULPS 14X20 (INSTRUMENTS) ×3 IMPLANT
LABEL OR SOLS (LABEL) ×3 IMPLANT
MANIFOLD NEPTUNE II (INSTRUMENTS) ×6 IMPLANT
NDL SAFETY ECLIPSE 18X1.5 (NEEDLE) ×1 IMPLANT
NDL SPNL 20GX3.5 QUINCKE YW (NEEDLE) ×2 IMPLANT
NEEDLE HYPO 18GX1.5 SHARP (NEEDLE) ×2
NEEDLE SPNL 20GX3.5 QUINCKE YW (NEEDLE) ×6 IMPLANT
NS IRRIG 500ML POUR BTL (IV SOLUTION) ×3 IMPLANT
PACK TOTAL KNEE (MISCELLANEOUS) ×3 IMPLANT
PAD WRAPON POLAR KNEE (MISCELLANEOUS) ×1 IMPLANT
PATELLA MEDIAL ATTUN 35MM KNEE (Knees) ×2 IMPLANT
PENCIL SMOKE ULTRAEVAC 22 CON (MISCELLANEOUS) ×3 IMPLANT
PIN FIXATION 1/8DIA X 3INL (PIN) ×9 IMPLANT
PULSAVAC PLUS IRRIG FAN TIP (DISPOSABLE) ×3
SOL .9 NS 3000ML IRR  AL (IV SOLUTION) ×2
SOL .9 NS 3000ML IRR UROMATIC (IV SOLUTION) ×1 IMPLANT
SOL PREP PVP 2OZ (MISCELLANEOUS) ×3
SOLUTION PREP PVP 2OZ (MISCELLANEOUS) ×1 IMPLANT
SPONGE DRAIN TRACH 4X4 STRL 2S (GAUZE/BANDAGES/DRESSINGS) ×3 IMPLANT
STAPLER SKIN PROX 35W (STAPLE) ×3 IMPLANT
STOCKINETTE IMPERV 14X48 (MISCELLANEOUS) IMPLANT
STRAP TIBIA SHORT (MISCELLANEOUS) ×3 IMPLANT
SUCTION FRAZIER HANDLE 10FR (MISCELLANEOUS) ×2
SUCTION TUBE FRAZIER 10FR DISP (MISCELLANEOUS) ×1 IMPLANT
SUT VIC AB 0 CT1 36 (SUTURE) ×6 IMPLANT
SUT VIC AB 1 CT1 36 (SUTURE) ×6 IMPLANT
SUT VIC AB 2-0 CT2 27 (SUTURE) ×3 IMPLANT
SYR 20ML LL LF (SYRINGE) ×3 IMPLANT
SYR 30ML LL (SYRINGE) ×6 IMPLANT
TIP FAN IRRIG PULSAVAC PLUS (DISPOSABLE) ×1 IMPLANT
TOWEL OR 17X26 4PK STRL BLUE (TOWEL DISPOSABLE) ×3 IMPLANT
TOWER CARTRIDGE SMART MIX (DISPOSABLE) ×3 IMPLANT
TRAY FOLEY MTR SLVR 16FR STAT (SET/KITS/TRAYS/PACK) ×3 IMPLANT
WRAPON POLAR PAD KNEE (MISCELLANEOUS) ×3

## 2019-12-04 NOTE — Evaluation (Signed)
Occupational Therapy Evaluation Patient Details Name: Amber Holmes MRN: [id number] DOB: Oct 19, 1950 Today's Date: 12/04/2019    History of Present Illness Amber Holmes is a 69 y.o. who presents c PMH of hypertension. S/p R total knee arthroplasty on 12/04/2019.   Clinical Impression   [name] was seen for OT evaluation this date, POD#0 from above surgery. PTA pt was independent in all ADLs however occasionally using SPC for mobility due to R knee pain. Pt lives with husband in home c ramped entrance. Pt presents to acute OT demonstrating impaired ADL performance and functional mobility 2/2 decreased activity tolerance, functional strength/balance deficits, and decreased LB access. Pt currently requires MOD A for LBD in sitting. SUPERVISION self-feeding seated EOB. CGA + RW for BSC t/f including perihygiene in standing. Pt instructed in polar care mgt, falls prevention strategies, home/routines modifications, DME/AE for LB bathing and dressing tasks, and compression stocking mgt. Pt would benefit from skilled OT services including additional instruction in dressing techniques with or without assistive devices for dressing and bathing skills to support recall and carryover prior to discharge and ultimately to maximize safety, independence, and minimize falls risk and caregiver burden. Do not currently anticipate any OT needs following this hospitalization.       Follow Up Recommendations  No OT follow up    Equipment Recommendations  None recommended by OT    Recommendations for Other Services       Precautions / Restrictions Precautions Precautions: Knee Restrictions Weight Bearing Restrictions: Yes RLE Weight Bearing: Weight bearing as tolerated      Mobility Bed Mobility Overal bed mobility: Modified Independent       Transfers Overall transfer level: Needs assistance Equipment used: Rolling walker (2 wheeled) Transfers: Sit to/from Stand Sit to Stand: Min guard;From  elevated surface              Balance Overall balance assessment: Needs assistance Sitting-balance support: No upper extremity supported;Feet supported Sitting balance-Leahy Scale: Good     Standing balance support: Single extremity supported;During functional activity Standing balance-Leahy Scale: Fair        ADL either performed or assessed with clinical judgement   ADL Overall ADL's : Needs assistance/impaired      General ADL Comments: MOD A for LBD in sitting. SUPERVISION self-feeding seated EOB. CGA + RW for BSC t/f including perihygiene in standing                  Pertinent Vitals/Pain Pain Assessment: 0-10 Pain Score: 2  Pain Location: R knee Pain Descriptors / Indicators: Dull;Discomfort Pain Intervention(s): Repositioned     Hand Dominance     Extremity/Trunk Assessment Upper Extremity Assessment Upper Extremity Assessment: Overall WFL for tasks assessed   Lower Extremity Assessment Lower Extremity Assessment: Generalized weakness;RLE deficits/detail RLE Deficits / Details: performs SLR.        Communication Communication Communication: No difficulties   Cognition Arousal/Alertness: Awake/alert Behavior During Therapy: WFL for tasks assessed/performed Overall Cognitive Status: Within Functional Limits for tasks assessed            General Comments       Exercises Exercises: Other exercises Other Exercises Other Exercises: Pt and caregiver educated re: OT role, DME recs, d/c recs, falls prevention, ECS, polar care mgmt, compression stocking mgmt, home/routines modifications Other Exercises: LBD, toileting, sup>sit, sit<>stand, sitting/standing balance/tolerance, self-feeding, ~15 ft in room mobility   Shoulder Instructions      Home Living Family/patient expects to be discharged to:: Private residence Living Arrangements:  Spouse/significant other Available Help at Discharge: Family;Available 24 hours/day Type of Home: House Home  Access: Ramped entrance (threshold step at door)     Home Layout: One level     Bathroom Shower/Tub: Tub/shower unit;Walk-in shower   Bathroom Toilet: Standard     Home Equipment: Environmental consultant - 2 wheels;Cane - single point;Bedside commode;Shower seat;Grab bars - tub/shower (grab bars in tub, shower chair in walk in )          Prior Functioning/Environment Level of Independence: Independent with assistive device(s)        Comments: Pt reports MOD I for mobility and ADLs using SPC as needed.         OT Problem List: Decreased range of motion;Decreased activity tolerance;Impaired balance (sitting and/or standing)      OT Treatment/Interventions: Self-care/ADL training;Therapeutic exercise;Energy conservation;DME and/or AE instruction;Therapeutic activities;Patient/family education;Balance training    OT Goals(Current goals can be found in the care plan section) Acute Rehab OT Goals Patient Stated Goal: To return home OT Goal Formulation: With patient Time For Goal Achievement: 12/18/19 Potential to Achieve Goals: Good ADL Goals Pt Will Perform Grooming: with modified independence;standing (c LRAD PRN) Pt Will Perform Lower Body Dressing: with caregiver independent in assisting;sit to/from stand;with min guard assist (c LRAD PRN) Pt Will Transfer to Toilet: with modified independence;ambulating;regular height toilet (c LRAD PRN)  OT Frequency: Min 1X/week    AM-PAC OT "6 Clicks" Daily Activity     Outcome Measure Help from another person eating meals?: None Help from another person taking care of personal grooming?: A Little Help from another person toileting, which includes using toliet, bedpan, or urinal?: A Little Help from another person bathing (including washing, rinsing, drying)?: A Little Help from another person to put on and taking off regular upper body clothing?: None Help from another person to put on and taking off regular lower body clothing?: A Little 6 Click  Score: 20   End of Session Equipment Utilized During Treatment: Rolling walker Nurse Communication: Mobility status  Activity Tolerance: Patient tolerated treatment well Patient left: in chair;with call bell/phone within reach;with chair alarm set;with nursing/sitter in room;with family/visitor present  OT Visit Diagnosis: Other abnormalities of gait and mobility (R26.89)                Time: 6433-2951 OT Time Calculation (min): 31 min Charges:  OT General Charges $OT Visit: 1 Visit OT Evaluation $OT Eval Low Complexity: 1 Low OT Treatments $Self Care/Home Management : 8-22 mins $Therapeutic Activity: 8-22 mins  Kathie Dike, M.S. OTR/L  12/04/19, 3:25 PM  ascom [phone]

## 2019-12-04 NOTE — Anesthesia Procedure Notes (Signed)
Spinal  Patient location during procedure: OR Start time: 12/04/2019 7:22 AM End time: 12/04/2019 7:26 AM Staffing Performed: resident/CRNA  Anesthesiologist: Corinda Gubler, MD Resident/CRNA: Lynden Oxford, CRNA Preanesthetic Checklist Completed: patient identified, IV checked, site marked, risks and benefits discussed, surgical consent, monitors and equipment checked, pre-op evaluation and timeout performed Spinal Block Patient position: sitting Prep: DuraPrep Patient monitoring: heart rate, cardiac monitor, continuous pulse ox and blood pressure Approach: midline Location: L3-4 Injection technique: single-shot Needle Needle type: Sprotte  Needle gauge: 24 G Needle length: 9 cm Assessment Sensory level: T4

## 2019-12-04 NOTE — H&P (Signed)
The patient has been re-examined, and the chart reviewed, and there have been no interval changes to the documented history and physical.    The risks, benefits, and alternatives have been discussed at length. The patient expressed understanding of the risks benefits and agreed with plans for surgical intervention.  Kiyan Burmester P. Aimie Wagman, Jr. M.D.    

## 2019-12-04 NOTE — Anesthesia Postprocedure Evaluation (Signed)
Anesthesia Post Note  Patient: Amber Holmes  Procedure(s) Performed: COMPUTER ASSISTED TOTAL KNEE ARTHROPLASTY - RNFA (Right Knee)  Patient location during evaluation: PACU Anesthesia Type: Combined General/Spinal Level of consciousness: oriented and awake and alert Pain management: pain level controlled Vital Signs Assessment: post-procedure vital signs reviewed and stable Respiratory status: spontaneous breathing, respiratory function stable and patient connected to nasal cannula oxygen Cardiovascular status: blood pressure returned to baseline and stable Postop Assessment: no headache, no backache and no apparent nausea or vomiting Anesthetic complications: no   No complications documented.   Last Vitals:  Vitals:   12/04/19 1153 12/04/19 1210  BP: (!) 142/61 134/67  Pulse: (!) 58 (!) 58  Resp: 14 17  Temp: 36.6 C 36.5 C  SpO2: 92% 93%    Last Pain:  Vitals:   12/04/19 1210  TempSrc: Oral  PainSc:                  Corinda Gubler

## 2019-12-04 NOTE — Progress Notes (Addendum)
°   12/04/19 0735  Clinical Encounter Type  Visited With Family  Visit Type Initial  Referral From Chaplain  Consult/Referral To Chaplain  This note was made on 12/04/19.     While rounding [facility] waiting area, chaplain briefly visited with Pt's husband to find out how he was doing while waiting. He was fine and didn't have any questions or concerns.

## 2019-12-04 NOTE — Evaluation (Signed)
Physical Therapy Evaluation Patient Details Name: Amber Holmes MRN: [id number] DOB: 14-Dec-1950 Today's Date: 12/04/2019   History of Present Illness  69 y.o. c PMH of hypertension. S/p R total knee arthroplasty on 12/04/2019.  Clinical Impression  Pt did very well with POD0 PT assessment.  She showed solid quad set and was able to do SLRs and all other exercises confidently, she was able to walk ~70 ft with consistent cadence and was not overly reliant on the walker, she had 0-91 ROM with very little relative discomfort.  Overall she did very well and I expect her to progress nicely with PT goals.  Pt with only very modest pain t/o exercises and ambulation, good attitude and effort.    Follow Up Recommendations Home health PT;Follow surgeon's recommendation for DC plan and follow-up therapies    Equipment Recommendations  None recommended by PT    Recommendations for Other Services       Precautions / Restrictions Precautions Precautions: Knee Restrictions Weight Bearing Restrictions: Yes RLE Weight Bearing: Weight bearing as tolerated      Mobility  Bed Mobility Overal bed mobility:  (in recliner on arrival, was mod indep with OT)                  Transfers Overall transfer level: Needs assistance Equipment used: Rolling walker (2 wheeled) Transfers: Sit to/from Stand Sit to Stand: Supervision         General transfer comment: Pt able to rise to standing without hesitation and with good safety  Ambulation/Gait Ambulation/Gait assistance: Min guard Gait Distance (Feet): 70 Feet Assistive device: Rolling walker (2 wheeled)       General Gait Details: Pt was able to quickly attain consistent cadence with good WBing tolerance on the R LE and overall did well with stable vitals.    Stairs            Wheelchair Mobility    Modified Rankin (Stroke Patients Only)       Balance Overall balance assessment: Modified Independent Sitting-balance support:  No upper extremity supported;Feet supported Sitting balance-Leahy Scale: Good     Standing balance support: Bilateral upper extremity supported Standing balance-Leahy Scale: Good Standing balance comment: Pt showed good confidence in standing with RW, no LOBs, buckling or unsteadiness                             Pertinent Vitals/Pain Pain Assessment: 0-10 Pain Score: 2  Pain Location: R knee Pain Descriptors / Indicators: Dull;Discomfort Pain Intervention(s): Repositioned    Home Living Family/patient expects to be discharged to:: Private residence Living Arrangements: Spouse/significant other Available Help at Discharge: Family;Available 24 hours/day Type of Home: House Home Access: Ramped entrance     Home Layout: One level Home Equipment: Walker - 2 wheels;Cane - single point;Bedside commode;Shower seat;Grab bars - tub/shower      Prior Function Level of Independence: Independent with assistive device(s)         Comments: Pt reports MOD I for mobility and ADLs using SPC as needed.      Hand Dominance        Extremity/Trunk Assessment   Upper Extremity Assessment Upper Extremity Assessment: Overall WFL for tasks assessed    Lower Extremity Assessment Lower Extremity Assessment: Generalized weakness RLE Deficits / Details: performs SLR.        Communication   Communication: No difficulties  Cognition Arousal/Alertness: Awake/alert Behavior During Therapy: WFL for tasks assessed/performed Overall  Cognitive Status: Within Functional Limits for tasks assessed                                        General Comments      Exercises Total Joint Exercises Ankle Circles/Pumps: AROM;10 reps Quad Sets: Strengthening;10 reps Short Arc Quad: AROM;Strengthening;10 reps Hip ABduction/ADduction: Strengthening;10 reps Straight Leg Raises: AROM;10 reps Long Arc Quad: Strengthening;10 reps Knee Flexion: Strengthening;10 reps (with  gentle overpressure on 5 reps) Goniometric ROM: 0-91 Other Exercises Other Exercises: Pt and caregiver educated re: OT role, DME recs, d/c recs, falls prevention, ECS, polar care mgmt, compression stocking mgmt, home/routines modifications Other Exercises: LBD, toileting, sup>sit, sit<>stand, sitting/standing balance/tolerance, self-feeding, ~15 ft in room mobility   Assessment/Plan    PT Assessment Patient needs continued PT services  PT Problem List Decreased strength;Decreased range of motion;Decreased activity tolerance;Decreased balance;Decreased mobility;Decreased coordination;Decreased cognition;Decreased knowledge of use of DME;Pain;Decreased safety awareness       PT Treatment Interventions DME instruction;Gait training;Stair training;Functional mobility training;Therapeutic exercise;Therapeutic activities;Balance training;Neuromuscular re-education;Patient/family education    PT Goals (Current goals can be found in the Care Plan section)  Acute Rehab PT Goals Patient Stated Goal: To return home PT Goal Formulation: With patient Time For Goal Achievement: 12/18/19 Potential to Achieve Goals: Good    Frequency BID   Barriers to discharge        Co-evaluation               AM-PAC PT "6 Clicks" Mobility  Outcome Measure Help needed turning from your back to your side while in a flat bed without using bedrails?: None Help needed moving from lying on your back to sitting on the side of a flat bed without using bedrails?: None Help needed moving to and from a bed to a chair (including a wheelchair)?: None Help needed standing up from a chair using your arms (e.g., wheelchair or bedside chair)?: None Help needed to walk in hospital room?: A Little Help needed climbing 3-5 steps with a railing? : A Little 6 Click Score: 22    End of Session Equipment Utilized During Treatment: Gait belt Activity Tolerance: Patient tolerated treatment well Patient left: with chair  alarm set;with call bell/phone within reach Nurse Communication: Mobility status PT Visit Diagnosis: Muscle weakness (generalized) (M62.81);Difficulty in walking, not elsewhere classified (R26.2);Pain Pain - Right/Left: Right Pain - part of body: Knee    Time: 2633-3545 PT Time Calculation (min) (ACUTE ONLY): 29 min   Charges:   PT Evaluation $PT Eval Low Complexity: 1 Low PT Treatments $Gait Training: 8-22 mins $Therapeutic Exercise: 8-22 mins        Malachi Pro, DPT 12/04/2019, 4:24 PM

## 2019-12-04 NOTE — Op Note (Signed)
OPERATIVE NOTE  DATE OF SURGERY:  12/04/2019  PATIENT NAME:  Amber Holmes   DOB: 11-Feb-1950  MRN: [id number]  PRE-OPERATIVE DIAGNOSIS: Degenerative arthrosis of the right knee, primary  POST-OPERATIVE DIAGNOSIS:  Same  PROCEDURE:  Right total knee arthroplasty using computer-assisted navigation  SURGEON:  Jena Gauss. M.D.  ANESTHESIA: spinal  ESTIMATED BLOOD LOSS: 50 mL  FLUIDS REPLACED: 1000 mL of crystalloid  TOURNIQUET TIME: 91 minutes  DRAINS: 2 medium Hemovac  SOFT TISSUE RELEASES: Anterior cruciate ligament, posterior cruciate ligament, deep medial collateral ligament, patellofemoral ligament  IMPLANTS UTILIZED: DePuy Attune size 5N posterior stabilized femoral component (cemented), size 4 rotating platform tibial component (cemented), 35 mm medialized dome patella (cemented), and a 5 mm stabilized rotating platform polyethylene insert.  INDICATIONS FOR SURGERY: Amber Holmes is a 69 y.o. year old female with a long history of progressive knee pain. X-rays demonstrated severe degenerative changes in tricompartmental fashion. The patient had not seen any significant improvement despite conservative nonsurgical intervention. After discussion of the risks and benefits of surgical intervention, the patient expressed understanding of the risks benefits and agree with plans for total knee arthroplasty.   The risks, benefits, and alternatives were discussed at length including but not limited to the risks of infection, bleeding, nerve injury, stiffness, blood clots, the need for revision surgery, cardiopulmonary complications, among others, and they were willing to proceed.  PROCEDURE IN DETAIL: The patient was brought into the operating room and, after adequate spinal anesthesia was achieved, a tourniquet was placed on the patient's upper thigh. The patient's knee and leg were cleaned and prepped with alcohol and DuraPrep and draped in the usual sterile fashion. A "timeout"  was performed as per usual protocol. The lower extremity was exsanguinated using an Esmarch, and the tourniquet was inflated to 300 mmHg. An anterior longitudinal incision was made followed by a standard mid vastus approach. The deep fibers of the medial collateral ligament were elevated in a subperiosteal fashion off of the medial flare of the tibia so as to maintain a continuous soft tissue sleeve. The patella was subluxed laterally and the patellofemoral ligament was incised. Inspection of the knee demonstrated severe degenerative changes with full-thickness loss of articular cartilage. Osteophytes were debrided using a rongeur. Anterior and posterior cruciate ligaments were excised. Two 4.0 mm Schanz pins were inserted in the femur and into the tibia for attachment of the array of trackers used for computer-assisted navigation. Hip center was identified using a circumduction technique. Distal landmarks were mapped using the computer. The distal femur and proximal tibia were mapped using the computer. The distal femoral cutting guide was positioned using computer-assisted navigation so as to achieve a 5 distal valgus cut. The femur was sized and it was felt that a size 5N femoral component was appropriate. A size 5 femoral cutting guide was positioned and the anterior cut was performed and verified using the computer. This was followed by completion of the posterior and chamfer cuts. Femoral cutting guide for the central box was then positioned in the center box cut was performed.  Attention was then directed to the proximal tibia. Medial and lateral menisci were excised. The extramedullary tibial cutting guide was positioned using computer-assisted navigation so as to achieve a 0 varus-valgus alignment and 3 posterior slope. The cut was performed and verified using the computer. The proximal tibia was sized and it was felt that a size 4 tibial tray was appropriate. Tibial and femoral trials were inserted  followed by insertion of  a 5 mm polyethylene insert. This allowed for excellent mediolateral soft tissue balancing both in flexion and in full extension. Finally, the patella was cut and prepared so as to accommodate a 35 mm medialized dome patella. A patella trial was placed and the knee was placed through a range of motion with excellent patellar tracking appreciated. The femoral trial was removed after debridement of posterior osteophytes. The central post-hole for the tibial component was reamed followed by insertion of a keel punch. Tibial trials were then removed. Cut surfaces of bone were irrigated with copious amounts of normal saline using pulsatile lavage and then suctioned dry. Polymethylmethacrylate cement was prepared in the usual fashion using a vacuum mixer. Cement was applied to the cut surface of the proximal tibia as well as along the undersurface of a size 4 rotating platform tibial component. Tibial component was positioned and impacted into place. Excess cement was removed using Personal assistant. Cement was then applied to the cut surfaces of the femur as well as along the posterior flanges of the size 5N femoral component. The femoral component was positioned and impacted into place. Excess cement was removed using Personal assistant. A 5 mm polyethylene trial was inserted and the knee was brought into full extension with steady axial compression applied. Finally, cement was applied to the backside of a 35 mm medialized dome patella and the patellar component was positioned and patellar clamp applied. Excess cement was removed using Personal assistant. After adequate curing of the cement, the tourniquet was deflated after a total tourniquet time of 91 minutes. Hemostasis was achieved using electrocautery. The knee was irrigated with copious amounts of normal saline using pulsatile lavage followed by 500 ml of Surgiphor and then suctioned dry. 20 mL of 1.3% Exparel and 60 mL of 0.25% Marcaine in 40 mL  of normal saline was injected along the posterior capsule, medial and lateral gutters, and along the arthrotomy site. A 5 mm stabilized rotating platform polyethylene insert was inserted and the knee was placed through a range of motion with excellent mediolateral soft tissue balancing appreciated and excellent patellar tracking noted. 2 medium drains were placed in the wound bed and brought out through separate stab incisions. The medial parapatellar portion of the incision was reapproximated using interrupted sutures of #1 Vicryl. Subcutaneous tissue was approximated in layers using first #0 Vicryl followed #2-0 Vicryl. The skin was approximated with skin staples. A sterile dressing was applied.  The patient tolerated the procedure well and was transported to the recovery room in stable condition.    Amber Holmes., M.D.

## 2019-12-04 NOTE — Transfer of Care (Signed)
Immediate Anesthesia Transfer of Care Note  Patient: Amber Holmes  Procedure(s) Performed: COMPUTER ASSISTED TOTAL KNEE ARTHROPLASTY - RNFA (Right Knee)  Patient Location: PACU  Anesthesia Type:General  Level of Consciousness: drowsy and patient cooperative  Airway & Oxygen Therapy: Patient Spontanous Breathing  Post-op Assessment: Report given to RN and Post -op Vital signs reviewed and stable  Post vital signs: Reviewed and stable  Last Vitals:  Vitals Value Taken Time  BP 124/77 12/04/19 1107  Temp    Pulse 70 12/04/19 1111  Resp 21 12/04/19 1111  SpO2 92 % 12/04/19 1111  Vitals shown include unvalidated device data.  Last Pain:  Vitals:   12/04/19 0636  TempSrc: Temporal  PainSc: 0-No pain         Complications: No complications documented.

## 2019-12-04 NOTE — Anesthesia Preprocedure Evaluation (Signed)
Anesthesia Evaluation  Patient identified by MRN, date of birth, ID band Patient awake    Reviewed: Allergy & Precautions, NPO status , Patient's Chart, lab work & pertinent test results  History of Anesthesia Complications Negative for: history of anesthetic complications  Airway Mallampati: II  TM Distance: >3 FB Neck ROM: Full    Dental  (+) Edentulous Upper, Edentulous Lower   Pulmonary neg pulmonary ROS, neg sleep apnea, neg COPD, Patient abstained from smoking.Not current smoker,    Pulmonary exam normal breath sounds clear to auscultation       Cardiovascular Exercise Tolerance: Good METShypertension, (-) CAD and (-) Past MI (-) dysrhythmias  Rhythm:Regular Rate:Normal - Systolic murmurs    Neuro/Psych negative neurological ROS  negative psych ROS   GI/Hepatic neg GERD  ,(+)     (-) substance abuse  ,   Endo/Other  neg diabetes  Renal/GU negative Renal ROS     Musculoskeletal   Abdominal (+) + obese,   Peds  Hematology   Anesthesia Other Findings Past Medical History: No date: History of kidney stones No date: Hypertension No date: Kidney stones  Reproductive/Obstetrics                             Anesthesia Physical Anesthesia Plan  ASA: II  Anesthesia Plan: General/Spinal   Post-op Pain Management:    Induction: Intravenous  PONV Risk Score and Plan: 3 and Ondansetron, Dexamethasone, Propofol infusion, TIVA and Midazolam  Airway Management Planned: Natural Airway  Additional Equipment: None  Intra-op Plan:   Post-operative Plan:   Informed Consent: I have reviewed the patients History and Physical, chart, labs and discussed the procedure including the risks, benefits and alternatives for the proposed anesthesia with the patient or authorized representative who has indicated his/her understanding and acceptance.       Plan Discussed with: CRNA and  Surgeon  Anesthesia Plan Comments: (Discussed R/B/A of neuraxial anesthesia technique with patient: - rare risks of spinal/epidural hematoma, nerve damage, infection - Risk of PDPH - Risk of nausea and vomiting - Risk of conversion to general anesthesia and its associated risks, including sore throat, damage to lips/teeth/oropharynx, and rare risks such as cardiac and respiratory events.  Patient voiced understanding.)        Anesthesia Quick Evaluation

## 2019-12-05 NOTE — Progress Notes (Signed)
ORTHOPAEDICS PROGRESS NOTE  PATIENT NAME: Amber Holmes DOB: May 20, 1950  MRN: [id number]  POD # 1: Right total knee arthroplasty  Subjective: The patient rested well last night.  Pain has been under good control.  She denies any nausea or vomiting. She did extremely well with physical therapy yesterday afternoon.  Objective: Vital signs in last 24 hours: Temp:  [97.6 F (36.4 C)-99.8 F (37.7 C)] 97.9 F (36.6 C) (11/06 0750) Pulse Rate:  [58-72] 62 (11/06 0750) Resp:  [11-25] 16 (11/06 0750) BP: (124-159)/(59-77) 133/66 (11/06 0750) SpO2:  [91 %-97 %] 97 % (11/06 0750) Weight:  [96.6 kg] 96.6 kg (11/05 1442)  Intake/Output from previous day: 11/05 0701 - 11/06 0700 In: 1960.4 [P.O.:120; I.V.:1540.4; IV Piggyback:300] Out: 870 [Urine:625; Drains:195; Blood:50]  No results for input(s): WBC, HGB, HCT, PLT, K, CL, CO2, BUN, CREATININE, GLUCOSE, CALCIUM, LABPT, INR in the last 72 hours.  EXAM General: Well-developed well-nourished female seen in no apparent discomfort. Lungs: clear to auscultation Cardiac: normal rate and regular rhythm Abdomen: Soft, nontender, nondistended.  Bowel sounds are present. Right lower extremity: Dressing is dry and intact.  Polar Care and Hemovac are in place and functioning.  Bone foam is in place with full extension noted.  The patient is able to perform an independent straight leg raise.  Homans test is negative. Neurologic: Awake, alert, and oriented.  Sensory and motor function are intact.  Assessment: Right total knee arthroplasty  Secondary diagnoses: Nephrolithiasis Hypertension  Plan: Today's goals were reviewed with the patient.  Continue physical therapy and Occupational Therapy as per total knee arthroplasty rehab protocol. Plan is to go Home after hospital stay. DVT Prophylaxis - Lovenox, Foot Pumps and TED hose  Shayma Pfefferle P. Angie Fava M.D.

## 2019-12-05 NOTE — TOC Progression Note (Signed)
Transition of Care [facility]) - Progression Note    Patient Details  Name: Dreamer Carillo MRN: [id number] Date of Birth: 07-22-1950  [facility] CM/SW Contact  Maud Deed, LCSW Phone Number: 12/05/2019, 3:37 PM  Clinical Narrative:    CSW was made aware that [facility] could not accept pt because of the county she [place]. CSW read the out to [facility] with [place] and he was able to accept the pt for [facility] PT.   Expected Discharge Plan: Home w Home Health Services Barriers to Discharge: Continued Medical Work up  Expected Discharge Plan and Services Expected Discharge Plan: Home w Home Health Services     Post Acute Care Choice: Home Health Living arrangements for the past 2 months: Single Family Home                           HH Arranged: PT HH Agency: [facility] Date HH Agency Contacted: 12/05/19 Time HH Agency Contacted: 1454 Representative spoke with at [facility] Agency: Cipriano Bunker   Social Determinants of Health (SDOH) Interventions    Readmission Risk Interventions No flowsheet data found.

## 2019-12-05 NOTE — Progress Notes (Signed)
Physical Therapy Treatment Patient Details Name: Amber Holmes MRN: [id number] DOB: [birth year] Today's Date: 12/05/2019    History of Present Illness 69 y.o. c PMH of hypertension. S/p R total knee arthroplasty on 12/04/2019.    PT Comments    Pt was sitting in recliner upon arriving. She is agreeable to 2nd session and continues to be very pleasant. Was able to stand and ambulate one lap in hallway prior to performing HEP/AROM exercises. Was able to flex knee to 92 degrees and has 0 degrees extension in long sitting. Tolerated performing HEP handout well. Overall pt is progressing extremely well from PT standpoint. Will benefit from HHPT to address strength and ROM deficits while assisting pt to PLOF. She was sitting in recliner post session with call bell in reach and RN aware of her abilities. Pt performed stairs in AM session without difficulty or safety concern.     Follow Up Recommendations  Home health PT     Equipment Recommendations  None recommended by PT    Recommendations for Other Services       Precautions / Restrictions Precautions Precautions: Knee Precaution Booklet Issued: Yes (comment) Restrictions Weight Bearing Restrictions: Yes RLE Weight Bearing: Weight bearing as tolerated    Mobility  Bed Mobility      General bed mobility comments: in recliner pre/post session  Transfers Overall transfer level: Modified independent Equipment used: Rolling walker (2 wheeled)    Ambulation/Gait Ambulation/Gait assistance: Supervision Gait Distance (Feet): 200 Feet Assistive device: Rolling walker (2 wheeled) Gait Pattern/deviations: Step-through pattern       Balance Overall balance assessment: Modified Independent         Standing balance support: Bilateral upper extremity supported;During functional activity Standing balance-Leahy Scale: Good      Cognition Arousal/Alertness: Awake/alert Behavior During Therapy: WFL for tasks  assessed/performed Overall Cognitive Status: Within Functional Limits for tasks assessed    General Comments: Pt is A and O x 4      Exercises Total Joint Exercises Ankle Circles/Pumps: AROM;10 reps Quad Sets: Strengthening;10 reps Gluteal Sets: AROM;10 reps Heel Slides: AROM;5 reps Hip ABduction/ADduction: AROM;10 reps Straight Leg Raises: AROM;10 reps Long Arc Quad: Strengthening;10 reps Knee Flexion: AROM;Seated;10 reps Goniometric ROM: 0-92        Pertinent Vitals/Pain Pain Assessment: 0-10 Pain Score: 4  Pain Location: R knee Pain Descriptors / Indicators: Dull;Discomfort Pain Intervention(s): Limited activity within patient's tolerance;Monitored during session;Premedicated before session;Repositioned;Ice applied           PT Goals (current goals can now be found in the care plan section) Acute Rehab PT Goals Patient Stated Goal: go home  Progress towards PT goals: Progressing toward goals    Frequency    BID      PT Plan Current plan remains appropriate       AM-PAC PT "6 Clicks" Mobility   Outcome Measure  Help needed turning from your back to your side while in a flat bed without using bedrails?: None Help needed moving from lying on your back to sitting on the side of a flat bed without using bedrails?: None Help needed moving to and from a bed to a chair (including a wheelchair)?: None Help needed standing up from a chair using your arms (e.g., wheelchair or bedside chair)?: None Help needed to walk in hospital room?: None Help needed climbing 3-5 steps with a railing? : A Little 6 Click Score: 23    End of Session Equipment Utilized During Treatment: Gait belt Activity Tolerance: Patient  tolerated treatment well Patient left: with chair alarm set;with call bell/phone within reach Nurse Communication: Mobility status PT Visit Diagnosis: Muscle weakness (generalized) (M62.81);Difficulty in walking, not elsewhere classified (R26.2);Pain Pain -  Right/Left: Right Pain - part of body: Knee     Time: 1330-1350 PT Time Calculation (min) (ACUTE ONLY): 20 min  Charges:  $Therapeutic Exercise: 8-22 mins                     Jetta Lout PTA 12/05/19, 2:25 PM

## 2019-12-05 NOTE — Progress Notes (Signed)
Physical Therapy Treatment Patient Details Name: Amber Holmes MRN: [id number] DOB: 09-Jan-1951 Today's Date: 12/05/2019    History of Present Illness 69 y.o. c PMH of hypertension. S/p R total knee arthroplasty on 12/04/2019.    PT Comments    Pt was long sitting in bed upon arriving. She agrees to PT session and is very pleasant throughout. Motivated to improve and return to PLOF. She was easily able to exit bed without assistance. Stood to [employer] and ambulated 200 ft without LOB. Preformed stair training with supervision. Returned to room and was repositioning in recliner post session with polar care applied, call bell in reach, and towel roll under RLE to promote ext. Recommend DC home with HHPT to follow. Pt is progressing extremely well. PM session to focus on increasing ROM and strength.      Follow Up Recommendations  Home health PT;Follow surgeon's recommendation for DC plan and follow-up therapies     Equipment Recommendations  None recommended by PT    Recommendations for Other Services       Precautions / Restrictions Precautions Precautions: Knee Precaution Booklet Issued: No Restrictions Weight Bearing Restrictions: Yes RLE Weight Bearing: Weight bearing as tolerated    Mobility  Bed Mobility Overal bed mobility: Modified Independent                Transfers Overall transfer level: Modified independent Equipment used: Rolling walker (2 wheeled)                Ambulation/Gait Ambulation/Gait assistance: Min guard;Supervision Gait Distance (Feet): 200 Feet Assistive device: Rolling walker (2 wheeled) Gait Pattern/deviations: Step-through pattern;Antalgic Gait velocity: decreased   General Gait Details: Pt was able to safely ambulate 200 ft without difficulty or LOB.    Stairs Stairs: Yes Stairs assistance: Supervision Stair Management: Two rails;Step to pattern Number of Stairs: 4 General stair comments: pt was able to safely ascend/descend  FOS without physical assistance        Balance Overall balance assessment: Modified Independent Sitting-balance support: No upper extremity supported;Feet supported Sitting balance-Leahy Scale: Good     Standing balance support: Bilateral upper extremity supported Standing balance-Leahy Scale: Good         Cognition Arousal/Alertness: Awake/alert Behavior During Therapy: WFL for tasks assessed/performed Overall Cognitive Status: Within Functional Limits for tasks assessed          General Comments: Pt is A and O x 4             Pertinent Vitals/Pain Pain Assessment: 0-10 Pain Score: 4  Pain Location: R knee Pain Descriptors / Indicators: Dull;Discomfort Pain Intervention(s): Limited activity within patient's tolerance;Monitored during session;Premedicated before session;Repositioned;Ice applied           PT Goals (current goals can now be found in the care plan section) Acute Rehab PT Goals Patient Stated Goal: go home  Progress towards PT goals: Progressing toward goals    Frequency    BID      PT Plan Current plan remains appropriate       AM-PAC PT "6 Clicks" Mobility   Outcome Measure  Help needed turning from your back to your side while in a flat bed without using bedrails?: None Help needed moving from lying on your back to sitting on the side of a flat bed without using bedrails?: None Help needed moving to and from a bed to a chair (including a wheelchair)?: None Help needed standing up from a chair using your arms (e.g., wheelchair or bedside  chair)?: None Help needed to walk in hospital room?: A Little Help needed climbing 3-5 steps with a railing? : A Little 6 Click Score: 22    End of Session Equipment Utilized During Treatment: Gait belt Activity Tolerance: Patient tolerated treatment well Patient left: with chair alarm set;with call bell/phone within reach Nurse Communication: Mobility status PT Visit Diagnosis: Muscle weakness  (generalized) (M62.81);Difficulty in walking, not elsewhere classified (R26.2);Pain Pain - Right/Left: Right Pain - part of body: Knee     Time: 1010-1037 PT Time Calculation (min) (ACUTE ONLY): 27 min  Charges:  $Gait Training: 23-37 mins                     Jetta Lout PTA 12/05/19, 12:04 PM

## 2019-12-05 NOTE — TOC Initial Note (Signed)
Transition of Care [facility]) - Initial/Assessment Note    Patient Details  Name: Amber Holmes MRN: [id number] Date of Birth: February 07, 1950  [facility]) CM/SW Contact:    Maud Deed, LCSW Phone Number: 12/05/2019, 2:55 PM  Clinical Narrative:                 CSW called and spoke with pt regarding discharge plans and PT recommendations. Pt and spouse was agreeable to [facility]. CSW reached out to [place] with [facility] and he was able to accept for PT.     Expected Discharge Plan: Home w Home Health Services Barriers to Discharge: Continued Medical Work up   Patient Goals and CMS Choice Patient states their goals for this hospitalization and ongoing recovery are:: to get back home and independent CMS Medicare.gov Compare Post Acute Care list provided to:: Patient Choice offered to / list presented to : Patient  Expected Discharge Plan and Services Expected Discharge Plan: Home w Home Health Services     Post Acute Care Choice: Home Health Living arrangements for the past 2 months: Single Family Home                           HH Arranged: PT HH Agency: [facility] Date HH Agency Contacted: 12/05/19 Time HH Agency Contacted: 1454 Representative spoke with at [facility] Agency: Cipriano Bunker  Prior Living Arrangements/Services Living arrangements for the past 2 months: Single Family Home Lives with:: Spouse Patient language and need for interpreter reviewed:: Yes Do you feel safe going back to the place where you live?: Yes      Need for Family Participation in Patient Care: Yes (Comment) Care giver support system in place?: Yes (comment) (spouse)   Criminal Activity/Legal Involvement Pertinent to Current Situation/Hospitalization: No - Comment as needed  Activities of Daily Living Home Assistive Devices/Equipment: Cane (specify quad or straight), Walker (specify type), Eyeglasses, Dentures (specify type), Blood pressure cuff ADL Screening (condition  at time of admission) Patient's cognitive ability adequate to safely complete daily activities?: Yes Is the patient deaf or have difficulty hearing?: No Does the patient have difficulty seeing, even when wearing glasses/contacts?: No Does the patient have difficulty concentrating, remembering, or making decisions?: No Patient able to express need for assistance with ADLs?: Yes Does the patient have difficulty dressing or bathing?: Yes Independently performs ADLs?: Yes (appropriate for developmental age) Does the patient have difficulty walking or climbing stairs?: Yes Weakness of Legs: Right Weakness of Arms/Hands: None  Permission Sought/Granted Permission sought to share information with : Facility Industrial/product designer granted to share information with : Yes, Verbal Permission Granted  Share Information with NAME: Maurine Minister     Permission granted to share info w Relationship: spouse  Permission granted to share info w Contact Information: [phone]  Emotional Assessment Appearance:: Other (Comment Required (unable to assess) Attitude/Demeanor/Rapport: Unable to Assess Affect (typically observed): Unable to Assess Orientation: : Oriented to Place, Oriented to  Time, Oriented to Situation, Oriented to Self Alcohol / Substance Use: Not Applicable Psych Involvement: No (comment)  Admission diagnosis:  Total knee replacement status [Z96.659] Patient Active Problem List   Diagnosis Date Noted  . Total knee replacement status 12/04/2019  . Primary osteoarthritis of both knees 07/27/2019  . Nephrolithiasis 11/02/2018   PCP:  Rhodia Albright, MD Pharmacy:   [facility] [phone] - [place], [place] - [address] [place] [address] AT [facility] OF [address] [place]  [place] [address] Phone: [phone] Fax: [phone]     Social Determinants of Health (SDOH) Interventions    Readmission Risk Interventions No flowsheet data found.

## 2019-12-06 MED ORDER — ENOXAPARIN SODIUM 40 MG/0.4ML ~~LOC~~ SOLN: 40.0000 mg | SUBCUTANEOUS | 0 refills | Status: DC

## 2019-12-06 MED ORDER — OXYCODONE HCL 5 MG PO TABS
5.0000 mg | ORAL_TABLET | ORAL | 0 refills | Status: DC | PRN
Start: 2019-12-06 — End: 2020-10-31

## 2019-12-06 MED ORDER — CELECOXIB 200 MG PO CAPS: 200.0000 mg | ORAL_CAPSULE | Freq: Two times a day (BID) | ORAL | 1 refills | Status: DC

## 2019-12-06 MED ORDER — TRAMADOL HCL 50 MG PO TABS: 50.0000 mg | ORAL_TABLET | Freq: Three times a day (TID) | ORAL | 0 refills | Status: DC | PRN

## 2019-12-06 NOTE — Progress Notes (Addendum)
Physical Therapy Treatment Patient Details Name: Amber Holmes MRN: [id number] DOB: [birth year] Today's Date: 12/06/2019    History of Present Illness 69 y.o. c PMH of hypertension. S/p R total knee arthroplasty on 12/04/2019.    PT Comments    Pre-medicated before session.  Ready.  Participated in exercises as described below.  To EOB with self assist for RLE.  Sitting steady. Stood with supervision and ambulated 30' with RW with steady gait.  She does report some dizziness this am which she attributes to recently taking pain meds.  She asks for a chair "To be safe."  BP 154/56 sitting and after a few minutes rest 133/81 P 67.  She is able to stand and continue gait an additional 70'.  Returned to room and remained in recliner.  Discussed discharge plan and safety including awareness of dizziness with pain meds and +1 assist for safety as a precaution. Completed stair training yesterday. Voiced understanding,  Discussed with RN.   Follow Up Recommendations  Home health PT     Equipment Recommendations  None recommended by PT    Recommendations for Other Services       Precautions / Restrictions Precautions Precautions: Knee Precaution Booklet Issued: Yes (comment) Restrictions Weight Bearing Restrictions: Yes RLE Weight Bearing: Weight bearing as tolerated    Mobility  Bed Mobility Overal bed mobility: Modified Independent                Transfers Overall transfer level: Modified independent   Transfers: Sit to/from Stand Sit to Stand: Supervision         General transfer comment: Pt able to rise to standing without hesitation and with good safety  Ambulation/Gait Ambulation/Gait assistance: Supervision Gait Distance (Feet): 70 Feet Assistive device: Rolling walker (2 wheeled) Gait Pattern/deviations: Step-through pattern Gait velocity: decreased   General Gait Details: some c/o dizziness today which she feels is related to recent pain meds.   Stairs          General stair comments: completed yesterday with ease.   Wheelchair Mobility    Modified Rankin (Stroke Patients Only)       Balance Overall balance assessment: Modified Independent         Standing balance support: Bilateral upper extremity supported;During functional activity Standing balance-Leahy Scale: Good                              Cognition Arousal/Alertness: Awake/alert Behavior During Therapy: WFL for tasks assessed/performed Overall Cognitive Status: Within Functional Limits for tasks assessed                                 General Comments: Pt is A and O x 4      Exercises Total Joint Exercises Ankle Circles/Pumps: AROM;10 reps Quad Sets: Strengthening;10 reps Gluteal Sets: AROM;10 reps Heel Slides: AROM;5 reps Hip ABduction/ADduction: AROM;10 reps Straight Leg Raises: AROM;10 reps Long Arc Quad: Strengthening;10 reps Knee Flexion: AROM;Seated;10 reps Goniometric ROM: 0-90 some increased pain/soreness today    General Comments        Pertinent Vitals/Pain Pain Assessment: 0-10 Pain Score: 5  Pain Location: R knee Pain Descriptors / Indicators: Dull;Discomfort Pain Intervention(s): Limited activity within patient's tolerance;Monitored during session;Premedicated before session    Home Living                      Prior  Function            PT Goals (current goals can now be found in the care plan section) Progress towards PT goals: Progressing toward goals    Frequency    BID      PT Plan Current plan remains appropriate    Co-evaluation              AM-PAC PT "6 Clicks" Mobility   Outcome Measure  Help needed turning from your back to your side while in a flat bed without using bedrails?: None Help needed moving from lying on your back to sitting on the side of a flat bed without using bedrails?: None Help needed moving to and from a bed to a chair (including a wheelchair)?:  None Help needed standing up from a chair using your arms (e.g., wheelchair or bedside chair)?: None Help needed to walk in hospital room?: None Help needed climbing 3-5 steps with a railing? : A Little 6 Click Score: 23    End of Session Equipment Utilized During Treatment: Gait belt Activity Tolerance: Patient tolerated treatment well Patient left: with chair alarm set;with call bell/phone within reach Nurse Communication: Mobility status PT Visit Diagnosis: Muscle weakness (generalized) (M62.81);Difficulty in walking, not elsewhere classified (R26.2);Pain Pain - Right/Left: Right Pain - part of body: Knee     Time: 1020-1043 PT Time Calculation (min) (ACUTE ONLY): 23 min  Charges:  $Gait Training: 8-22 mins $Therapeutic Exercise: 8-22 mins                    Danielle Dess, PTA 12/06/19, 11:39 AM

## 2019-12-06 NOTE — Progress Notes (Signed)
Patient discharging home. Instructions given to patient, verbalized understanding.  

## 2019-12-06 NOTE — Plan of Care (Signed)
  Problem: Education: Goal: Verbalization of understanding the information provided (i.e., activity precautions, restrictions, etc) will improve Outcome: Adequate for Discharge Goal: Individualized Educational Video(s) Outcome: Adequate for Discharge   Problem: Activity: Goal: Ability to ambulate and perform ADLs will improve Outcome: Adequate for Discharge   Problem: Clinical Measurements: Goal: Postoperative complications will be avoided or minimized Outcome: Adequate for Discharge   Problem: Self-Concept: Goal: Ability to maintain and perform role responsibilities to the fullest extent possible will improve Outcome: Adequate for Discharge   Problem: Pain Management: Goal: Pain level will decrease Outcome: Adequate for Discharge   

## 2019-12-06 NOTE — Progress Notes (Signed)
  Subjective: 2 Days Post-Op Procedure(s) (LRB): COMPUTER ASSISTED TOTAL KNEE ARTHROPLASTY - RNFA (Right) Patient reports pain as mild.   Patient is well, and has had no acute complaints or problems Plan is to go Home after hospital stay. Negative for chest pain and shortness of breath Fever: no Gastrointestinal:Negative for nausea and vomiting  Objective: Vital signs in last 24 hours: Temp:  [97.8 F (36.6 C)-98.3 F (36.8 C)] 97.8 F (36.6 C) (11/07 0813) Pulse Rate:  [59-66] 59 (11/07 0813) Resp:  [16-18] 18 (11/07 0813) BP: (108-154)/(57-77) 154/57 (11/07 0813) SpO2:  [95 %-99 %] 99 % (11/07 0813)  Intake/Output from previous day:  Intake/Output Summary (Last 24 hours) at 12/06/2019 0912 Last data filed at 12/06/2019 0508 Gross per 24 hour  Intake --  Output 180 ml  Net -180 ml    Intake/Output this shift: No intake/output data recorded.  Labs: No results for input(s): HGB in the last 72 hours. No results for input(s): WBC, RBC, HCT, PLT in the last 72 hours. No results for input(s): NA, K, CL, CO2, BUN, CREATININE, GLUCOSE, CALCIUM in the last 72 hours. No results for input(s): LABPT, INR in the last 72 hours.   EXAM General - Patient is Alert, Appropriate and Oriented Extremity - ABD soft Neurovascular intact Sensation intact distally Intact pulses distally Dorsiflexion/Plantar flexion intact Incision: dressing C/D/I No cellulitis present Dressing/Incision - clean, dry, no drainage, bulky dressing and hemovac removed this AM. Motor Function - intact, moving foot and toes well on exam.  Abdomen soft with normal bowel sounds.  Past Medical History:  Diagnosis Date  . History of kidney stones   . Hypertension   . Kidney stones     Assessment/Plan: 2 Days Post-Op Procedure(s) (LRB): COMPUTER ASSISTED TOTAL KNEE ARTHROPLASTY - RNFA (Right) Active Problems:   Total knee replacement status  Estimated body mass index is 38.95 kg/m as calculated from the  following:   Height as of this encounter: 5\' 2"  (1.575 m).   Weight as of this encounter: 96.6 kg. Advance diet Up with therapy   Vitals stable this AM. Hemovac removed today, bulky dressing removed.  Honeycomb dressing intact. Patient has cleared all the steps with PT.  Patient has had a BM. Plan for discharge home today after breakfast and morning session with PT.  DVT Prophylaxis - Lovenox, Foot Pumps and TED hose Weight-Bearing as tolerated to right leg  J. , PA-C [facility] 12/06/2019, 9:12 AM

## 2019-12-06 NOTE — Discharge Summary (Signed)
Physician Discharge Summary  Patient ID: Amber Holmes MRN: [id number] DOB/AGE: 1950-08-31 69 y.o.  Admit date: 12/04/2019 Discharge date: 12/06/2019  Admission Diagnoses:  Total knee replacement status [Z96.659]  Primary degenerative arthrosis of the right knee  Discharge Diagnoses: Patient Active Problem List   Diagnosis Date Noted  . Total knee replacement status 12/04/2019  . Primary osteoarthritis of both knees 07/27/2019  . Nephrolithiasis 11/02/2018    Past Medical History:  Diagnosis Date  . History of kidney stones   . Hypertension   . Kidney stones    Transfusion: None.   Consultants (if any):   Discharged Condition: Improved  Hospital Course: Amber Holmes is an 69 y.o. female who was admitted 12/04/2019 with a diagnosis of primary osteoarthritis of the right knee and went to the operating room on 12/04/2019 and underwent the above named procedures.    Surgeries: Procedure(s): COMPUTER ASSISTED TOTAL KNEE ARTHROPLASTY - RNFA on 12/04/2019 Patient tolerated the surgery well. Taken to PACU where she was stabilized and then transferred to the orthopedic floor.  Started on Lovenox 30mg  q 12 hrs. Foot pumps applied bilaterally at 80 mm. Heels elevated on bed with rolled towels. No evidence of DVT. Negative Homan. Physical therapy started on day #1 for gait training and transfer. OT started day #1 for ADL and assisted devices.  Patient's IV and hemovac were removed on POD2.  Foley was removed on POD1.  Implants: DePuy Attune size 5N posterior stabilized femoral component (cemented), size 4 rotating platform tibial component (cemented), 35 mm medialized dome patella (cemented), and a 5 mm stabilized rotating platform polyethylene insert.  She was given perioperative antibiotics:  Anti-infectives (From admission, onward)   Start     Dose/Rate Route Frequency Ordered Stop   12/04/19 0624  ceFAZolin (ANCEF) 2-4 GM/100ML-% IVPB       Note to Pharmacy: 13/05/21   :  cabinet override      12/04/19 0624 12/04/19 0753   12/04/19 0600  ceFAZolin (ANCEF) IVPB 2g/100 mL premix        2 g 200 mL/hr over 30 Minutes Intravenous On call to O.R. 12/03/19 2251 12/04/19 0752   12/03/19 2300  ceFAZolin (ANCEF) IVPB 2g/100 mL premix        2 g 200 mL/hr over 30 Minutes Intravenous Every 6 hours 12/03/19 2251 12/04/19 1059    .  She was given sequential compression devices, early ambulation, and Lovenox for DVT prophylaxis.  She benefited maximally from the hospital stay and there were no complications.    Recent vital signs:  Vitals:   12/06/19 0333 12/06/19 0813  BP: 138/67 (!) 154/57  Pulse: 64 (!) 59  Resp: 17 18  Temp: 98.2 F (36.8 C) 97.8 F (36.6 C)  SpO2: 96% 99%    Recent laboratory studies:  Lab Results  Component Value Date   HGB 13.7 11/27/2019   HGB 14.0 08/11/2018   Lab Results  Component Value Date   WBC 4.4 11/27/2019   PLT 158 11/27/2019   Lab Results  Component Value Date   INR 0.9 11/27/2019   Lab Results  Component Value Date   NA 138 11/27/2019   K 3.5 11/27/2019   CL 102 11/27/2019   CO2 27 11/27/2019   BUN 17 11/27/2019   CREATININE 0.65 11/27/2019   GLUCOSE 98 11/27/2019   Discharge Medications:   Allergies as of 12/06/2019      Reactions   Penicillins Swelling, Rash   IgE = 82 (WNL) on 11/27/2019  Whites in eyes started swelling Did it involve swelling of the face/tongue/throat, SOB, or low BP? Yes Did it involve sudden or severe rash/hives, skin peeling, or any reaction on the inside of your mouth or nose? No Did you need to seek medical attention at a hospital or doctor's office? Yes When did it last happen?10 Years If all above answers are "NO", may proceed with cephalosporin use.      Medication List    STOP taking these medications   aspirin EC 81 MG tablet     TAKE these medications   acetaminophen 500 MG tablet Commonly known as: TYLENOL Take 1,000 mg by mouth every 6 (six) hours as  needed for headache.   Advil 200 MG Caps Generic drug: Ibuprofen Take 2 capsules by mouth daily as needed.   amLODipine 5 MG tablet Commonly known as: NORVASC Take 5 mg by mouth daily.   celecoxib 200 MG capsule Commonly known as: CELEBREX Take 1 capsule (200 mg total) by mouth 2 (two) times daily.   cetirizine 10 MG tablet Commonly known as: ZYRTEC Take 10 mg by mouth.   ELDERBERRY PO Take 2 tablets by mouth daily.   enoxaparin 40 MG/0.4ML injection Commonly known as: LOVENOX Inject 0.4 mLs (40 mg total) into the skin daily.   ICY HOT EX Apply 1 application topically daily as needed (knee pain).   Melatonin 10 MG Caps Take 10 mg by mouth at bedtime as needed (sleep).   Osteo Bi-Flex Adv Triple St Tabs Take 1 tablet by mouth 2 (two) times a day.   oxyCODONE 5 MG immediate release tablet Commonly known as: Oxy IR/ROXICODONE Take 1-2 tablets (5-10 mg total) by mouth every 4 (four) hours as needed for moderate pain.   traMADol 50 MG tablet Commonly known as: ULTRAM Take 1 tablet (50 mg total) by mouth every 8 (eight) hours as needed for moderate pain.   Vitamin D (Ergocalciferol) 1.25 MG (50000 UNIT) Caps capsule Commonly known as: DRISDOL Take 50,000 Units by mouth every Sunday.   Voltaren 1 % Gel Generic drug: diclofenac Sodium Apply 2 g topically 4 (four) times daily as needed.            Durable Medical Equipment  (From admission, onward)         Start     Ordered   12/04/19 1225  DME Walker rolling  Once       Question:  Patient needs a walker to treat with the following condition  Answer:  Total knee replacement status   12/04/19 1224   12/04/19 1225  DME Bedside commode  Once       Question:  Patient needs a bedside commode to treat with the following condition  Answer:  Total knee replacement status   12/04/19 1224          Diagnostic Studies: DG Knee Right Port  Result Date: 12/04/2019 CLINICAL DATA:  Status post right total knee  replacement. EXAM: PORTABLE RIGHT KNEE - 1-2 VIEW COMPARISON:  None. FINDINGS: The right femoral and tibial components appear to be well situated. Surgical drain is seen in the soft tissues anterior to the distal femur. IMPRESSION: Status post right total knee arthroplasty. Electronically Signed   By: Lupita Raider M.D.   On: 12/04/2019 11:24   Disposition: Plan for discharge home today following morning session of physical therapy.   Follow-up Information    [facility] On 12/18/2019.   Specialty: Orthopedic Surgery Why: at 9:15am  Contact information: [address] [facility] [place] [address] [phone]        Donato Heinz, MD On 01/19/2020.   Specialty: Orthopedic Surgery Why: at 2:15pm Contact information: [address] [facility] [place] [address] [phone]              Signed: Meriel Pica PA-C 12/06/2019, 9:16 AM

## 2019-12-07 ENCOUNTER — Encounter: Payer: Self-pay | Admitting: Orthopedic Surgery

## 2020-01-22 DIAGNOSIS — E559 Vitamin D deficiency, unspecified: Secondary | ICD-10-CM | POA: Insufficient documentation

## 2020-01-22 DIAGNOSIS — M810 Age-related osteoporosis without current pathological fracture: Secondary | ICD-10-CM | POA: Insufficient documentation

## 2020-10-19 ENCOUNTER — Other Ambulatory Visit: Payer: Self-pay

## 2020-10-19 ENCOUNTER — Other Ambulatory Visit
Admission: RE | Admit: 2020-10-19 | Discharge: 2020-10-19 | Disposition: A | Discharge status: Home / Self Care | Payer: Medicare Other | Source: Ambulatory Visit | Attending: Orthopedic Surgery | Admitting: Orthopedic Surgery

## 2020-10-19 DIAGNOSIS — R4182 Altered mental status, unspecified: Secondary | ICD-10-CM | POA: Insufficient documentation

## 2020-10-19 DIAGNOSIS — Z01818 Encounter for other preprocedural examination: Secondary | ICD-10-CM | POA: Insufficient documentation

## 2020-10-19 HISTORY — DX: Pneumonia, unspecified organism: J18.9

## 2020-10-19 HISTORY — DX: Unspecified osteoarthritis, unspecified site: M19.90

## 2020-10-19 HISTORY — DX: Headache, unspecified: R51.9

## 2020-10-19 LAB — TYPE AND SCREEN
ABO/RH(D): O POS
Antibody Screen: NEGATIVE

## 2020-10-19 LAB — COMPREHENSIVE METABOLIC PANEL
ALT: 19 U/L (ref 0–44)
AST: 21 U/L (ref 15–41)
Albumin: 4 g/dL (ref 3.5–5.0)
Alkaline Phosphatase: 78 U/L (ref 38–126)
Anion gap: 10 (ref 5–15)
BUN: 17 mg/dL (ref 8–23)
CO2: 28 mmol/L (ref 22–32)
Calcium: 9.4 mg/dL (ref 8.9–10.3)
Chloride: 102 mmol/L (ref 98–111)
Creatinine, Ser: 0.79 mg/dL (ref 0.44–1.00)
GFR, Estimated: >60 mL/min (ref >60)
Glucose, Bld: 93 mg/dL (ref 70–99)
Potassium: 3.7 mmol/L (ref 3.5–5.1)
Sodium: 140 mmol/L (ref 135–145)
Total Bilirubin: 0.8 mg/dL (ref 0.3–1.2)
Total Protein: 6.9 g/dL (ref 6.5–8.1)

## 2020-10-19 LAB — URINALYSIS, ROUTINE W REFLEX MICROSCOPIC
Bacteria, UA: NONE SEEN
Bilirubin Urine: NEGATIVE
Glucose, UA: NEGATIVE mg/dL
Hgb urine dipstick: NEGATIVE
Ketones, ur: NEGATIVE mg/dL
Nitrite: NEGATIVE
Protein, ur: NEGATIVE mg/dL
Specific Gravity, Urine: 1.02 (ref 1.005–1.030)
pH: 5 (ref 5.0–8.0)

## 2020-10-19 LAB — CBC
HCT: 40.7 % (ref 36.0–46.0)
Hemoglobin: 13.8 g/dL (ref 12.0–15.0)
MCH: 30 pg (ref 26.0–34.0)
MCHC: 33.9 g/dL (ref 30.0–36.0)
MCV: 88.5 fL (ref 80.0–100.0)
Platelets: 163 10*3/uL (ref 150–400)
RBC: 4.6 MIL/uL (ref 3.87–5.11)
RDW: 12.8 % (ref 11.5–15.5)
WBC: 4.8 10*3/uL (ref 4.0–10.5)
nRBC: 0 % (ref 0.0–0.2)

## 2020-10-19 LAB — C-REACTIVE PROTEIN: CRP: 0.6 mg/dL (ref <1.0)

## 2020-10-19 LAB — SURGICAL PCR SCREEN
MRSA, PCR: NEGATIVE
Staphylococcus aureus: NEGATIVE

## 2020-10-19 LAB — SEDIMENTATION RATE: Sed Rate: 23 mm/hr (ref 0–30)

## 2020-10-19 LAB — PROTIME-INR
INR: 0.9 (ref 0.8–1.2)
Prothrombin Time: 12.5 seconds (ref 11.4–15.2)

## 2020-10-19 LAB — APTT: aPTT: 30 seconds (ref 24–36)

## 2020-10-19 MED ORDER — FAMOTIDINE 20 MG PO TABS
20.0000 mg | ORAL_TABLET | Freq: Once | ORAL | Status: DC
  Filled 2020-10-19: qty 1

## 2020-10-19 NOTE — Patient Instructions (Addendum)
Your procedure is scheduled on: 10/31/20 - MONDAY Report to the Registration Desk on the 1st floor of the [facility] [place]. To find out your arrival time, please call [phone] between 1PM - 3PM on: 10/28/20 - Friday COVID TEST- REPORT [facility] ON 10/27/20 BETWEEN 8 AM AND 12:00.  REMEMBER: Instructions that are not followed completely may result in serious medical risk, up to and including death; or upon the discretion of your surgeon and anesthesiologist your surgery may need to be rescheduled.  Do not eat food after midnight the night before surgery.  No gum chewing, lozengers or hard candies.  You may however, drink CLEAR liquids up to 2 hours before you are scheduled to arrive for your surgery. Do not drink anything within 2 hours of your scheduled arrival time.  Clear liquids include: - water  - apple juice without pulp - gatorade (not RED, PURPLE, OR BLUE) - black coffee or tea (Do NOT add milk or creamers to the coffee or tea) Do NOT drink anything that is not on this list.  In addition, your doctor has ordered for you to drink the provided  Ensure Pre-Surgery Clear Carbohydrate Drink  Drinking this carbohydrate drink up to two hours before surgery helps to reduce insulin resistance and improve patient outcomes. Please complete drinking 2 hours prior to scheduled arrival time.  TAKE THESE MEDICATIONS THE MORNING OF SURGERY WITH A SIP OF WATER: - amLODipine (NORVASC) 5 MG tablet  Follow recommendations from Cardiologist, Pulmonologist or PCP regarding stopping Aspirin, Coumadin, Plavix, Eliquis, Pradaxa, or Pletal. Stop Aspirin 10/23/20.  One week prior to surgery: STOP TAKING ON 10/23/20. Stop Anti-inflammatories (NSAIDS) such as Advil, Aleve, Ibuprofen, Motrin, Naproxen, Naprosyn and Aspirin based products such as Excedrin, Goodys Powder, BC Powder.  Stop TAKING BEGINNING 10/23/20, ANY OVER THE COUNTER supplements until after surgery.  You may  take Tylenol AS DIRECTED if needed for pain up until the day of surgery.  No Alcohol for 24 hours before or after surgery.  No Smoking including e-cigarettes for 24 hours prior to surgery.  No chewable tobacco products for at least 6 hours prior to surgery.  No nicotine patches on the day of surgery.  Do not use any "recreational" drugs for at least a week prior to your surgery.  Please be advised that the combination of cocaine and anesthesia may have negative outcomes, up to and including death. If you test positive for cocaine, your surgery will be cancelled.  On the morning of surgery brush your teeth with toothpaste and water, you may rinse your mouth with mouthwash if you wish. Do not swallow any toothpaste or mouthwash.  Use CHG Soap or wipes as directed on instruction sheet.  Do not wear jewelry, make-up, hairpins, clips or nail polish.  Do not wear lotions, powders, or perfumes.   Do not shave body from the neck down 48 hours prior to surgery just in case you cut yourself which could leave a site for infection.  Also, freshly shaved skin may become irritated if using the CHG soap.  Contact lenses, hearing aids and dentures may not be worn into surgery.  Do not bring valuables to the hospital. [facility] is not responsible for any missing/lost belongings or valuables.   Notify your doctor if there is any change in your medical condition (cold, fever, infection).  Wear comfortable clothing (specific to your surgery type) to the hospital.  After surgery, you can help prevent lung complications by  doing breathing exercises.  Take deep breaths and cough every 1-2 hours. Your doctor may order a device called an Incentive Spirometer to help you take deep breaths. When coughing or sneezing, hold a pillow firmly against your incision with both hands. This is called "splinting." Doing this helps protect your incision. It also decreases belly discomfort.  If you are being admitted  to the hospital overnight, leave your suitcase in the car. After surgery it may be brought to your room.  If you are being discharged the day of surgery, you will not be allowed to drive home. You will need a responsible adult (18 years or older) to drive you home and stay with you that night.   If you are taking public transportation, you will need to have a responsible adult (18 years or older) with you. Please confirm with your physician that it is acceptable to use public transportation.   Please call the Pre-admissions Testing Dept. at [phone] if you have any questions about these instructions.  Surgery Visitation Policy:  Patients undergoing a surgery or procedure may have one family member or support person with them as long as that person is not COVID-19 positive or experiencing its symptoms.  That person may remain in the waiting area during the procedure and may rotate out with other people.  Inpatient Visitation:    Visiting hours are 7 a.m. to 8 p.m. Up to two visitors ages 16+ are allowed at one time in a patient room. The visitors may rotate out with other people during the day. Visitors must check out when they leave, or other visitors will not be allowed. One designated support person may remain overnight. The visitor must pass COVID-19 screenings, use hand sanitizer when entering and exiting the patient's room and wear a mask at all times, including in the patient's room. Patients must also wear a mask when staff or their visitor are in the room. Masking is required regardless of vaccination status.

## 2020-10-20 LAB — URINE CULTURE: Culture: NO GROWTH

## 2020-10-20 NOTE — Discharge Instructions (Signed)
Instructions after Total Knee Replacement   Amber Holmes, Jr., M.D.     Dept. of Orthopaedics & Sports Medicine  [facility]  [address]  [place], [place]  [address]  Phone: [phone]   Fax: [phone]    DIET: Drink plenty of non-alcoholic fluids. Resume your normal diet. Include foods high in fiber.  ACTIVITY:  You may use crutches or a walker with weight-bearing as tolerated, unless instructed otherwise. You may be weaned off of the walker or crutches by your Physical Therapist.  Do NOT place pillows under the knee. Anything placed under the knee could limit your ability to straighten the knee.   Continue doing gentle exercises. Exercising will reduce the pain and swelling, increase motion, and prevent muscle weakness.   Please continue to use the TED compression stockings for 6 weeks. You may remove the stockings at night, but should reapply them in the morning. Do not drive or operate any equipment until instructed.  WOUND CARE:  Continue to use the PolarCare or ice packs periodically to reduce pain and swelling. You may bathe or shower after the staples are removed at the first office visit following surgery.  MEDICATIONS: You may resume your regular medications. Please take the pain medication as prescribed on the medication. Do not take pain medication on an empty stomach. You have been given a prescription for a blood thinner (Lovenox or Coumadin). Please take the medication as instructed. (NOTE: After completing a 2 week course of Lovenox, take one Enteric-coated aspirin once a day. This along with elevation will help reduce the possibility of phlebitis in your operated leg.) Do not drive or drink alcoholic beverages when taking pain medications.  CALL THE OFFICE FOR: Temperature above 101 degrees Excessive bleeding or drainage on the dressing. Excessive swelling, coldness, or paleness of the toes. Persistent nausea and vomiting.  FOLLOW-UP:  You  should have an appointment to return to the office in 10-14 days after surgery. Arrangements have been made for continuation of Physical Therapy (either home therapy or outpatient therapy).   [facility] Department Directory         [email or web address]       [email or web address]          Cardiology  Appointments: [place] - [phone] [place] - [phone]  Endocrinology  Appointments: [place] - [phone] [place] - [phone]  Gastroenterology  Appointments: [place] - [phone] [place] - [phone]        General Surgery   Appointments: [place] - [phone]  Internal Medicine/Family Medicine  Appointments: [place] - [phone] [place] - [phone] [place] - [phone]  Metabolic and Weigh Loss Surgery  Appointments: [place] - [phone]        Neurology  Appointments: [place] - [phone] [place] - [phone]  Neurosurgery  Appointments: [place] - [phone]  Obstetrics & Gynecology  Appointments: [place] - [phone] [place] - [phone]        Pediatrics  Appointments: [place] - [phone] [place] - [phone]  Physiatry  Appointments: [place] -[phone]  Physical Therapy  Appointments: [place] - [phone] [place] - [phone]        Podiatry  Appointments: [place] - [phone] [place] - [phone]  Pulmonology  Appointments: [place] - [phone]  Rheumatology  Appointments: [place] - [phone]        [place] Location: [facility]  [address] , [place]  [address]  [place] Location: [facility] [address] [place], [place]  [address]  [place] Location: [facility] [address] [place], [place]  [address]    

## 2020-10-27 ENCOUNTER — Other Ambulatory Visit: Admission: RE | Admit: 2020-10-27 | Payer: Medicare Other | Source: Ambulatory Visit

## 2020-11-06 NOTE — H&P (Signed)
ORTHOPAEDIC HISTORY & PHYSICAL Michelene Gardener, [place] - 10/21/2020 8:15 AM EDT Formatting of this note is different from the original. [facility] Chief Complaint:   Chief Complaint  Patient presents with   Knee Pain  H & P LEFT KNEE   History of Present Illness:   Amber Holmes is a 70 y.o. female that presents to clinic today for her preoperative history and evaluation. Patient presents unaccompanied. The patient is scheduled to undergo a left total knee arthroplasty on 10/31/20 by Dr. Ernest Pine. Her pain began several years ago. The pain is located primarily along the medial aspect of the knee. She describes her pain as worse with weightbearing. She reports associated swelling with some giving way of the knee. She denies associated numbness or tingling, denies locking.   The patient's symptoms have progressed to the point that they decrease her quality of life. The patient has previously undergone conservative treatment including NSAIDS and injections to the knee without adequate control of her symptoms.  Patient denies any significant cardiac history, lumbar surgery, or blood clots. Patient does report an allergy to penicillin.  Past Medical, Surgical, Family, Social History, Allergies, Medications:   Past Medical History:  Past Medical History:  Diagnosis Date   Hypertension   Past Surgical History:  Past Surgical History:  Procedure Laterality Date   Right total knee arthroplasty using computer-assisted navigation 12/04/2019  Dr Ernest Pine   Current Medications:  Current Outpatient Medications  Medication Sig Dispense Refill   amLODIPine (NORVASC) 5 MG tablet 5 mg once daily   aspirin 81 MG EC tablet Take 81 mg by mouth once daily   cetirizine (ZYRTEC) 10 MG tablet Take 10 mg by mouth once daily   cholecalciferol (VITAMIN D3) 1000 unit capsule Take 1 capsule by mouth once daily   diclofenac (VOLTAREN) 1 % topical gel Apply 2 g  topically 2 (two) times daily   docusate (COLACE) 100 MG capsule Take 100 mg by mouth once daily   ELDERBERRY FRUIT ORAL Take 2 tablets by mouth once daily   glucosam/chon-msm1/C/mang/bosw (OSTEO BI-FLEX TRIPLE STRENGTH ORAL) Take 1 tablet by mouth 2 (two) times daily   ibuprofen (MOTRIN) 600 MG tablet 1,200 mg every 6 (six) hours as needed   No current facility-administered medications for this visit.   Allergies:  Allergies  Allergen Reactions   Penicillins Swelling, Rash and Hives  Swelling of eye IgE = 82 (WNL) on 11/27/2019 Whites in eyes started swelling Did it involve swelling of the face/tongue/throat, SOB, or low BP? Yes Did it involve sudden or severe rash/hives, skin peeling, or any reaction on the inside of your mouth or nose? No Did you need to seek medical attention at a hospital or doctor's office? Yes When did it last happen?      10 Years If all above answers are "NO", may proceed with cephalosporin use.   Social History:  Social History   Socioeconomic History   Marital status: Married  Spouse name: Maurine Minister   Number of children: 2   Years of education: 12   Highest education level: High school graduate  Occupational History   Occupation: Retired  Tobacco Use   Smoking status: Never Smoker   Smokeless tobacco: Never Used  Building services engineer Use: Never used  Substance and Sexual Activity   Alcohol use: Never   Drug use: Never   Sexual activity: Yes  Partners: Male   Family History:  Family History  Problem Relation Age of Onset   High blood pressure (Hypertension) Mother   Diabetes type II Sister   Heart disease Maternal Grandmother   Stroke Paternal Grandmother   Review of Systems:   A 10+ ROS was performed, reviewed, and the pertinent orthopaedic findings are documented in the HPI.   Physical Examination:   BP (!) 150/98 (BP Location: Left upper arm, Patient Position: Sitting, BP Cuff Size: Large Adult)  Ht 157.5 cm (5\' 2" )  Wt 95.7 kg  (211 lb)  BMI 38.59 kg/m   Patient is a well-developed, well-nourished female in no acute distress. Patient has normal mood and affect. Patient is alert and oriented to person, place, and time.   HEENT: Atraumatic, normocephalic. Pupils equal and reactive to light. Extraocular motion intact. Noninjected sclera.  Cardiovascular: Regular rate and rhythm, with no murmurs, rubs, or gallops. Distal pulses palpable.  Respiratory: Lungs clear to auscultation bilaterally.   Left Knee: Soft tissue swelling: mild Effusion: minimal Erythema: none Crepitance: mild Tenderness: medial Alignment: relative varus Mediolateral laxity: medial pseudolaxity Posterior sag: negative Patellar tracking: Good tracking without evidence of subluxation or tilt Atrophy: No significant atrophy.  Quadriceps tone was fair to good. Range of motion: 0/6/122 degrees  Sensation intact over the saphenous, lateral sural cutaneous, superficial fibular, and deep fibular nerve distributions.  Tests Performed/Reviewed:  X-rays  3 views of the left knee were obtained. Images reveal severe loss of medial compartment joint space with osteophyte formation. No fractures or dislocations. No other osseous abnormality noted  I personally ordered and interpreted today's x-rays.  Impression:   ICD-10-CM  1. Primary osteoarthritis of left knee M17.12   Plan:   The patient has end-stage degenerative changes of the left knee. It was explained to the patient that the condition is progressive in nature. Having failed conservative treatment, the patient has elected to proceed with a total joint arthroplasty. The patient will undergo a total joint arthroplasty with Dr. [id number]. The risks of surgery, including blood clot and infection, were discussed with the patient. Measures to reduce these risks, including the use of anticoagulation, perioperative antibiotics, and early ambulation were discussed. The importance of postoperative  physical therapy was discussed with the patient. The patient elects to proceed with surgery. The patient is instructed to stop all blood thinners prior to surgery. The patient is instructed to call the hospital the day before surgery to learn of the proper arrival time.   Contact our office with any questions or concerns. Follow up as indicated, or sooner should any new problems arise, if conditions worsen, or if they are otherwise concerned.   Ernest Pine, PA -C [facility] Orthopaedics and Sports Medicine [address] [place], [place] Phone: [phone]  This note was generated in part with voice recognition software and I apologize for any typographical errors that were not detected and corrected.  Electronically signed by [phone], PA at 10/21/2020 6:33 PM EDT

## 2020-11-09 ENCOUNTER — Other Ambulatory Visit: Admission: RE | Admit: 2020-11-09 | Payer: Medicare Other | Source: Ambulatory Visit

## 2020-11-10 ENCOUNTER — Other Ambulatory Visit
Admission: RE | Admit: 2020-11-10 | Discharge: 2020-11-10 | Disposition: A | Discharge status: Home / Self Care | Payer: Medicare Other | Source: Ambulatory Visit | Attending: Orthopedic Surgery | Admitting: Orthopedic Surgery

## 2020-11-10 NOTE — Pre-Procedure Instructions (Signed)
Called patient to remind her of scheduled COVID test today She informed me she tested COVID + on 9.27.22. Took a 10 day course of doxycycline /completed. She spoke w Elmarie Shiley RN she does not need a covid test . I confirmed with Quentin Mulling NP. Scheduled for surgery tomorrow 10.14 22. Denies any symptoms of COVID.

## 2020-11-11 ENCOUNTER — Ambulatory Visit: Payer: Medicare Other | Admitting: Urgent Care

## 2020-11-11 ENCOUNTER — Observation Stay: Payer: Medicare Other

## 2020-11-11 ENCOUNTER — Encounter
Admission: RE | Disposition: A | Discharge status: Home / Self Care | Payer: Self-pay | Source: Home / Self Care | Attending: Orthopedic Surgery

## 2020-11-11 ENCOUNTER — Other Ambulatory Visit: Payer: Self-pay

## 2020-11-11 ENCOUNTER — Observation Stay
Admission: RE | Admit: 2020-11-11 | Discharge: 2020-11-12 | Disposition: A | Discharge status: Home / Self Care | Payer: Medicare Other | Attending: Orthopedic Surgery | Admitting: Orthopedic Surgery

## 2020-11-11 ENCOUNTER — Encounter: Payer: Self-pay | Admitting: Orthopedic Surgery

## 2020-11-11 DIAGNOSIS — M1712 Unilateral primary osteoarthritis, left knee: Secondary | ICD-10-CM | POA: Diagnosis present

## 2020-11-11 DIAGNOSIS — Z7982 Long term (current) use of aspirin: Secondary | ICD-10-CM | POA: Insufficient documentation

## 2020-11-11 DIAGNOSIS — Z96659 Presence of unspecified artificial knee joint: Secondary | ICD-10-CM

## 2020-11-11 DIAGNOSIS — Z79899 Other long term (current) drug therapy: Secondary | ICD-10-CM | POA: Insufficient documentation

## 2020-11-11 DIAGNOSIS — I10 Essential (primary) hypertension: Secondary | ICD-10-CM | POA: Diagnosis not present

## 2020-11-11 DIAGNOSIS — Z96651 Presence of right artificial knee joint: Secondary | ICD-10-CM | POA: Insufficient documentation

## 2020-11-11 HISTORY — PX: KNEE ARTHROPLASTY: SHX992

## 2020-11-11 LAB — TYPE AND SCREEN
ABO/RH(D): O POS
Antibody Screen: NEGATIVE

## 2020-11-11 SURGERY — ARTHROPLASTY, KNEE, TOTAL, USING IMAGELESS COMPUTER-ASSISTED NAVIGATION
Anesthesia: Spinal | Site: Knee | Laterality: Left

## 2020-11-11 MED ORDER — BUPIVACAINE HCL (PF) 0.5 % IJ SOLN
INTRAMUSCULAR | Status: DC | PRN
  Administered 2020-11-11: 3 mL

## 2020-11-11 MED ORDER — LORATADINE 10 MG PO TABS
10.0000 mg | ORAL_TABLET | Freq: Every day | ORAL | Status: DC
  Administered 2020-11-12: 10 mg via ORAL
  Filled 2020-11-11: qty 1

## 2020-11-11 MED ORDER — ACETAMINOPHEN 10 MG/ML IV SOLN
INTRAVENOUS | Status: AC
  Filled 2020-11-11: qty 100

## 2020-11-11 MED ORDER — ONDANSETRON HCL 4 MG/2ML IJ SOLN
4.0000 mg | Freq: Four times a day (QID) | INTRAMUSCULAR | Status: DC | PRN
  Administered 2020-11-11: 4 mg via INTRAVENOUS
  Filled 2020-11-11: qty 2

## 2020-11-11 MED ORDER — ACETAMINOPHEN 10 MG/ML IV SOLN
INTRAVENOUS | Status: DC | PRN
  Administered 2020-11-11: 1000 mg via INTRAVENOUS

## 2020-11-11 MED ORDER — PROPOFOL 1000 MG/100ML IV EMUL
INTRAVENOUS | Status: AC
  Filled 2020-11-11: qty 100

## 2020-11-11 MED ORDER — HYDROMORPHONE HCL 1 MG/ML IJ SOLN: 0.5000 mg | INTRAMUSCULAR | Status: DC | PRN

## 2020-11-11 MED ORDER — FENTANYL CITRATE (PF) 100 MCG/2ML IJ SOLN
25.0000 ug | INTRAMUSCULAR | Status: DC | PRN
  Administered 2020-11-11: 25 ug via INTRAVENOUS

## 2020-11-11 MED ORDER — SODIUM CHLORIDE (PF) 0.9 % IJ SOLN
INTRAMUSCULAR | Status: DC | PRN
  Administered 2020-11-11: 120 mL via INTRAMUSCULAR

## 2020-11-11 MED ORDER — GABAPENTIN 300 MG PO CAPS: 300.0000 mg | ORAL_CAPSULE | Freq: Once | ORAL | Status: AC

## 2020-11-11 MED ORDER — CHLORHEXIDINE GLUCONATE 0.12 % MT SOLN
OROMUCOSAL | Status: AC
  Administered 2020-11-11: 15 mL via OROMUCOSAL
  Filled 2020-11-11: qty 15

## 2020-11-11 MED ORDER — SURGIPHOR WOUND IRRIGATION SYSTEM - OPTIME
TOPICAL | Status: DC | PRN
  Administered 2020-11-11: 1 via TOPICAL

## 2020-11-11 MED ORDER — TRANEXAMIC ACID-NACL 1000-0.7 MG/100ML-% IV SOLN
1000.0000 mg | Freq: Once | INTRAVENOUS | Status: AC
  Administered 2020-11-11: 1000 mg via INTRAVENOUS

## 2020-11-11 MED ORDER — MAGNESIUM HYDROXIDE 400 MG/5ML PO SUSP
30.0000 mL | Freq: Every day | ORAL | Status: DC
  Administered 2020-11-11: 30 mL via ORAL
  Filled 2020-11-11: qty 30

## 2020-11-11 MED ORDER — TRAMADOL HCL 50 MG PO TABS
50.0000 mg | ORAL_TABLET | ORAL | Status: DC | PRN
  Administered 2020-11-11 – 2020-11-12 (×2): 100 mg via ORAL
  Administered 2020-11-12: 50 mg via ORAL
  Filled 2020-11-11: qty 1
  Filled 2020-11-11 (×2): qty 2

## 2020-11-11 MED ORDER — METOCLOPRAMIDE HCL 10 MG PO TABS
10.0000 mg | ORAL_TABLET | Freq: Three times a day (TID) | ORAL | Status: DC
  Administered 2020-11-11 – 2020-11-12 (×4): 10 mg via ORAL
  Filled 2020-11-11 (×4): qty 1

## 2020-11-11 MED ORDER — OXYCODONE HCL 5 MG/5ML PO SOLN: 5.0000 mg | Freq: Once | ORAL | Status: DC | PRN

## 2020-11-11 MED ORDER — CHLORHEXIDINE GLUCONATE 4 % EX LIQD: 60.0000 mL | Freq: Once | CUTANEOUS | Status: DC

## 2020-11-11 MED ORDER — TRANEXAMIC ACID-NACL 1000-0.7 MG/100ML-% IV SOLN
INTRAVENOUS | Status: AC
  Filled 2020-11-11: qty 100

## 2020-11-11 MED ORDER — GABAPENTIN 300 MG PO CAPS
ORAL_CAPSULE | ORAL | Status: AC
  Administered 2020-11-11: 300 mg via ORAL
  Filled 2020-11-11: qty 1

## 2020-11-11 MED ORDER — 0.9 % SODIUM CHLORIDE (POUR BTL) OPTIME
TOPICAL | Status: DC | PRN
  Administered 2020-11-11: 500 mL

## 2020-11-11 MED ORDER — PANTOPRAZOLE SODIUM 40 MG PO TBEC
40.0000 mg | DELAYED_RELEASE_TABLET | Freq: Two times a day (BID) | ORAL | Status: DC
  Administered 2020-11-11 – 2020-11-12 (×3): 40 mg via ORAL
  Filled 2020-11-11 (×3): qty 1

## 2020-11-11 MED ORDER — FLEET ENEMA 7-19 GM/118ML RE ENEM: 1.0000 | ENEMA | Freq: Once | RECTAL | Status: DC | PRN

## 2020-11-11 MED ORDER — FERROUS SULFATE 325 (65 FE) MG PO TABS
325.0000 mg | ORAL_TABLET | Freq: Two times a day (BID) | ORAL | Status: DC
  Administered 2020-11-12: 325 mg via ORAL
  Filled 2020-11-11: qty 1

## 2020-11-11 MED ORDER — SENNOSIDES-DOCUSATE SODIUM 8.6-50 MG PO TABS
1.0000 | ORAL_TABLET | Freq: Two times a day (BID) | ORAL | Status: DC
  Administered 2020-11-11 – 2020-11-12 (×2): 1 via ORAL
  Filled 2020-11-11 (×2): qty 1

## 2020-11-11 MED ORDER — FENTANYL CITRATE (PF) 100 MCG/2ML IJ SOLN
INTRAMUSCULAR | Status: AC
  Filled 2020-11-11: qty 2

## 2020-11-11 MED ORDER — OXYCODONE HCL 5 MG PO TABS
10.0000 mg | ORAL_TABLET | ORAL | Status: DC | PRN
  Administered 2020-11-11: 10 mg via ORAL
  Filled 2020-11-11: qty 2

## 2020-11-11 MED ORDER — ENOXAPARIN SODIUM 30 MG/0.3ML IJ SOSY
30.0000 mg | PREFILLED_SYRINGE | Freq: Two times a day (BID) | INTRAMUSCULAR | Status: DC
  Administered 2020-11-12: 30 mg via SUBCUTANEOUS
  Filled 2020-11-11: qty 0.3

## 2020-11-11 MED ORDER — ALUM & MAG HYDROXIDE-SIMETH 200-200-20 MG/5ML PO SUSP: 30.0000 mL | ORAL | Status: DC | PRN

## 2020-11-11 MED ORDER — LACTATED RINGERS IV SOLN: INTRAVENOUS | Status: DC

## 2020-11-11 MED ORDER — MENTHOL 3 MG MT LOZG
1.0000 | LOZENGE | OROMUCOSAL | Status: DC | PRN
  Filled 2020-11-11: qty 9

## 2020-11-11 MED ORDER — PHENYLEPHRINE HCL-NACL 20-0.9 MG/250ML-% IV SOLN
INTRAVENOUS | Status: DC | PRN
  Administered 2020-11-11: 20 ug/min via INTRAVENOUS

## 2020-11-11 MED ORDER — ACETAMINOPHEN 10 MG/ML IV SOLN
1000.0000 mg | Freq: Four times a day (QID) | INTRAVENOUS | Status: DC
  Administered 2020-11-11 – 2020-11-12 (×3): 1000 mg via INTRAVENOUS
  Filled 2020-11-11 (×4): qty 100

## 2020-11-11 MED ORDER — CELECOXIB 200 MG PO CAPS
200.0000 mg | ORAL_CAPSULE | Freq: Two times a day (BID) | ORAL | Status: DC
  Administered 2020-11-11 – 2020-11-12 (×2): 200 mg via ORAL
  Filled 2020-11-11 (×2): qty 1

## 2020-11-11 MED ORDER — PHENOL 1.4 % MT LIQD
1.0000 | OROMUCOSAL | Status: DC | PRN
  Filled 2020-11-11: qty 177

## 2020-11-11 MED ORDER — CELECOXIB 200 MG PO CAPS: 400.0000 mg | ORAL_CAPSULE | Freq: Once | ORAL | Status: AC

## 2020-11-11 MED ORDER — SODIUM CHLORIDE 0.9 % IV SOLN: INTRAVENOUS | Status: DC

## 2020-11-11 MED ORDER — MIDAZOLAM HCL 2 MG/2ML IJ SOLN
INTRAMUSCULAR | Status: AC
  Filled 2020-11-11: qty 2

## 2020-11-11 MED ORDER — SODIUM CHLORIDE FLUSH 0.9 % IV SOLN
INTRAVENOUS | Status: AC
  Filled 2020-11-11: qty 40

## 2020-11-11 MED ORDER — FAMOTIDINE 20 MG PO TABS
ORAL_TABLET | ORAL | Status: AC
  Administered 2020-11-11: 20 mg
  Filled 2020-11-11: qty 1

## 2020-11-11 MED ORDER — OXYCODONE HCL 5 MG PO TABS: 5.0000 mg | ORAL_TABLET | Freq: Once | ORAL | Status: DC | PRN

## 2020-11-11 MED ORDER — GLYCOPYRROLATE 0.2 MG/ML IJ SOLN
INTRAMUSCULAR | Status: DC | PRN
  Administered 2020-11-11: .2 mg via INTRAVENOUS

## 2020-11-11 MED ORDER — CEFAZOLIN SODIUM-DEXTROSE 2-4 GM/100ML-% IV SOLN
INTRAVENOUS | Status: AC
  Filled 2020-11-11: qty 100

## 2020-11-11 MED ORDER — ENSURE PRE-SURGERY PO LIQD
296.0000 mL | Freq: Once | ORAL | Status: DC
  Filled 2020-11-11: qty 296

## 2020-11-11 MED ORDER — CHLORHEXIDINE GLUCONATE 0.12 % MT SOLN: 15.0000 mL | Freq: Once | OROMUCOSAL | Status: AC

## 2020-11-11 MED ORDER — DEXAMETHASONE SODIUM PHOSPHATE 10 MG/ML IJ SOLN
INTRAMUSCULAR | Status: AC
  Administered 2020-11-11: 8 mg via INTRAVENOUS
  Filled 2020-11-11: qty 1

## 2020-11-11 MED ORDER — OXYCODONE HCL 5 MG PO TABS: 5.0000 mg | ORAL_TABLET | ORAL | Status: DC | PRN

## 2020-11-11 MED ORDER — ORAL CARE MOUTH RINSE: 15.0000 mL | Freq: Once | OROMUCOSAL | Status: AC

## 2020-11-11 MED ORDER — MIDAZOLAM HCL 5 MG/5ML IJ SOLN
INTRAMUSCULAR | Status: DC | PRN
  Administered 2020-11-11: 2 mg via INTRAVENOUS

## 2020-11-11 MED ORDER — BUPIVACAINE HCL (PF) 0.25 % IJ SOLN
INTRAMUSCULAR | Status: AC
  Filled 2020-11-11: qty 60

## 2020-11-11 MED ORDER — ACETAMINOPHEN 325 MG PO TABS: 325.0000 mg | ORAL_TABLET | Freq: Four times a day (QID) | ORAL | Status: DC | PRN

## 2020-11-11 MED ORDER — TRANEXAMIC ACID-NACL 1000-0.7 MG/100ML-% IV SOLN
1000.0000 mg | INTRAVENOUS | Status: AC
  Administered 2020-11-11: 1000 mg via INTRAVENOUS

## 2020-11-11 MED ORDER — BUPIVACAINE LIPOSOME 1.3 % IJ SUSP
INTRAMUSCULAR | Status: AC
  Filled 2020-11-11: qty 20

## 2020-11-11 MED ORDER — ONDANSETRON HCL 4 MG PO TABS
4.0000 mg | ORAL_TABLET | Freq: Four times a day (QID) | ORAL | Status: DC | PRN
  Administered 2020-11-12: 4 mg via ORAL
  Filled 2020-11-11: qty 1

## 2020-11-11 MED ORDER — CEFAZOLIN SODIUM-DEXTROSE 2-4 GM/100ML-% IV SOLN
2.0000 g | Freq: Four times a day (QID) | INTRAVENOUS | Status: AC
  Administered 2020-11-11 (×2): 2 g via INTRAVENOUS
  Filled 2020-11-11 (×2): qty 100

## 2020-11-11 MED ORDER — DEXAMETHASONE SODIUM PHOSPHATE 10 MG/ML IJ SOLN: 8.0000 mg | Freq: Once | INTRAMUSCULAR | Status: AC

## 2020-11-11 MED ORDER — SODIUM CHLORIDE 0.9 % IR SOLN
Status: DC | PRN
  Administered 2020-11-11: 3000 mL

## 2020-11-11 MED ORDER — PROPOFOL 500 MG/50ML IV EMUL
INTRAVENOUS | Status: DC | PRN
  Administered 2020-11-11: 120 ug/kg/min via INTRAVENOUS

## 2020-11-11 MED ORDER — FENTANYL CITRATE (PF) 100 MCG/2ML IJ SOLN
INTRAMUSCULAR | Status: DC | PRN
  Administered 2020-11-11 (×2): 50 ug via INTRAVENOUS

## 2020-11-11 MED ORDER — AMLODIPINE BESYLATE 5 MG PO TABS
5.0000 mg | ORAL_TABLET | Freq: Every day | ORAL | Status: DC
  Administered 2020-11-12: 5 mg via ORAL
  Filled 2020-11-11: qty 1

## 2020-11-11 MED ORDER — DEXMEDETOMIDINE (PRECEDEX) IN NS 20 MCG/5ML (4 MCG/ML) IV SYRINGE
PREFILLED_SYRINGE | INTRAVENOUS | Status: DC | PRN
  Administered 2020-11-11 (×2): 8 ug via INTRAVENOUS
  Administered 2020-11-11: 4 ug via INTRAVENOUS

## 2020-11-11 MED ORDER — CELECOXIB 200 MG PO CAPS
ORAL_CAPSULE | ORAL | Status: AC
  Administered 2020-11-11: 400 mg via ORAL
  Filled 2020-11-11: qty 2

## 2020-11-11 MED ORDER — VITAMIN D 25 MCG (1000 UNIT) PO TABS
1000.0000 [IU] | ORAL_TABLET | Freq: Every day | ORAL | Status: DC
  Administered 2020-11-12: 1000 [IU] via ORAL
  Filled 2020-11-11: qty 1

## 2020-11-11 MED ORDER — CEFAZOLIN SODIUM-DEXTROSE 2-4 GM/100ML-% IV SOLN
2.0000 g | INTRAVENOUS | Status: AC
  Administered 2020-11-11: 2 g via INTRAVENOUS

## 2020-11-11 MED ORDER — DIPHENHYDRAMINE HCL 12.5 MG/5ML PO ELIX: 12.5000 mg | ORAL_SOLUTION | ORAL | Status: DC | PRN

## 2020-11-11 MED ORDER — BISACODYL 10 MG RE SUPP: 10.0000 mg | Freq: Every day | RECTAL | Status: DC | PRN

## 2020-11-11 SURGICAL SUPPLY — 75 items
ATTUNE PSFEM LTSZ5 NARCEM KNEE (Femur) ×1 IMPLANT
ATTUNE PSRP INSR SZ5 6 KNEE (Insert) ×1 IMPLANT
BASEPLATE TIBIAL ROTATING SZ 4 (Knees) ×1 IMPLANT
BATTERY INSTRU NAVIGATION (MISCELLANEOUS) ×8 IMPLANT
BLADE SAW 70X12.5 (BLADE) ×2 IMPLANT
BLADE SAW 90X13X1.19 OSCILLAT (BLADE) ×2 IMPLANT
BLADE SAW 90X25X1.19 OSCILLAT (BLADE) ×2 IMPLANT
CEMENT BONE GENTAMICIN (Cement) IMPLANT
CEMENT HV SMART SET (Cement) ×2 IMPLANT
COOLER POLAR GLACIER W/PUMP (MISCELLANEOUS) ×2 IMPLANT
CUFF TOURN SGL QUICK 24 (TOURNIQUET CUFF)
CUFF TOURN SGL QUICK 34 (TOURNIQUET CUFF)
CUFF TRNQT CYL 24X4X16.5-23 (TOURNIQUET CUFF) IMPLANT
CUFF TRNQT CYL 34X4.125X (TOURNIQUET CUFF) IMPLANT
DRAPE 3/4 80X56 (DRAPES) ×2 IMPLANT
DRAPE INCISE IOBAN 66X45 STRL (DRAPES) ×2 IMPLANT
DRSG DERMACEA 8X12 NADH (GAUZE/BANDAGES/DRESSINGS) ×2 IMPLANT
DRSG MEPILEX SACRM 8.7X9.8 (GAUZE/BANDAGES/DRESSINGS) ×2 IMPLANT
DRSG OPSITE POSTOP 4X14 (GAUZE/BANDAGES/DRESSINGS) ×2 IMPLANT
DRSG TEGADERM 4X4.75 (GAUZE/BANDAGES/DRESSINGS) ×2 IMPLANT
DURAPREP 26ML APPLICATOR (WOUND CARE) ×4 IMPLANT
ELECT CAUTERY BLADE 6.4 (BLADE) ×2 IMPLANT
ELECT REM PT RETURN 9FT ADLT (ELECTROSURGICAL) ×2
ELECTRODE REM PT RTRN 9FT ADLT (ELECTROSURGICAL) ×1 IMPLANT
EX-PIN ORTHOLOCK NAV 4X150 (PIN) ×4 IMPLANT
GLOVE SRG 8 PF TXTR STRL LF DI (GLOVE) ×1 IMPLANT
GLOVE SURG ENC TEXT LTX SZ7.5 (GLOVE) ×4 IMPLANT
GLOVE SURG UNDER POLY LF SZ7.5 (GLOVE) ×8 IMPLANT
GLOVE SURG UNDER POLY LF SZ8 (GLOVE) ×1
GOWN STRL REUS W/ TWL LRG LVL3 (GOWN DISPOSABLE) ×2 IMPLANT
GOWN STRL REUS W/ TWL XL LVL3 (GOWN DISPOSABLE) ×1 IMPLANT
GOWN STRL REUS W/TWL LRG LVL3 (GOWN DISPOSABLE) ×2
GOWN STRL REUS W/TWL XL LVL3 (GOWN DISPOSABLE) ×1
HEMOVAC 400CC 10FR (MISCELLANEOUS) ×2 IMPLANT
HOLDER FOLEY CATH W/STRAP (MISCELLANEOUS) ×2 IMPLANT
IRRIGATION SURGIPHOR STRL (IV SOLUTION) ×2 IMPLANT
IV NS IRRIG 3000ML ARTHROMATIC (IV SOLUTION) ×2 IMPLANT
KIT TURNOVER KIT A (KITS) ×2 IMPLANT
KNIFE SCULPS 14X20 (INSTRUMENTS) ×2 IMPLANT
LABEL OR SOLS (LABEL) ×2 IMPLANT
MANIFOLD NEPTUNE II (INSTRUMENTS) ×4 IMPLANT
NDL SAFETY ECLIPSE 18X1.5 (NEEDLE) ×1 IMPLANT
NDL SPNL 20GX3.5 QUINCKE YW (NEEDLE) ×2 IMPLANT
NEEDLE HYPO 18GX1.5 SHARP (NEEDLE) ×1
NEEDLE SPNL 20GX3.5 QUINCKE YW (NEEDLE) ×4 IMPLANT
NS IRRIG 500ML POUR BTL (IV SOLUTION) ×2 IMPLANT
PACK TOTAL KNEE (MISCELLANEOUS) ×2 IMPLANT
PAD ABD DERMACEA PRESS 5X9 (GAUZE/BANDAGES/DRESSINGS) ×4 IMPLANT
PAD WRAPON POLAR KNEE (MISCELLANEOUS) ×1 IMPLANT
PATELLA MEDIAL ATTUN 35MM KNEE (Knees) ×1 IMPLANT
PENCIL SMOKE EVACUATOR COATED (MISCELLANEOUS) ×2 IMPLANT
PIN DRILL FIX HALF THREAD (BIT) ×3 IMPLANT
PIN DRILL QUICK PACK ×2 IMPLANT
PIN FIXATION 1/8DIA X 3INL (PIN) ×2 IMPLANT
PULSAVAC PLUS IRRIG FAN TIP (DISPOSABLE) ×2
SOL PREP PVP 2OZ (MISCELLANEOUS) ×2
SOLUTION PREP PVP 2OZ (MISCELLANEOUS) ×1 IMPLANT
SPONGE DRAIN TRACH 4X4 STRL 2S (GAUZE/BANDAGES/DRESSINGS) ×2 IMPLANT
SPONGE T-LAP 18X18 ~~LOC~~+RFID (SPONGE) ×6 IMPLANT
STAPLER SKIN PROX 35W (STAPLE) ×2 IMPLANT
STOCKINETTE IMPERV 14X48 (MISCELLANEOUS) ×1 IMPLANT
STRAP TIBIA SHORT (MISCELLANEOUS) ×2 IMPLANT
SUCTION FRAZIER HANDLE 10FR (MISCELLANEOUS) ×1
SUCTION TUBE FRAZIER 10FR DISP (MISCELLANEOUS) ×1 IMPLANT
SUT VIC AB 0 CT1 36 (SUTURE) ×4 IMPLANT
SUT VIC AB 1 CT1 36 (SUTURE) ×4 IMPLANT
SUT VIC AB 2-0 CT2 27 (SUTURE) ×2 IMPLANT
SYR 20ML LL LF (SYRINGE) ×2 IMPLANT
SYR 30ML LL (SYRINGE) ×4 IMPLANT
TIP FAN IRRIG PULSAVAC PLUS (DISPOSABLE) ×1 IMPLANT
TOWEL OR 17X26 4PK STRL BLUE (TOWEL DISPOSABLE) ×2 IMPLANT
TOWER CARTRIDGE SMART MIX (DISPOSABLE) ×2 IMPLANT
TRAY FOLEY MTR SLVR 16FR STAT (SET/KITS/TRAYS/PACK) ×2 IMPLANT
WATER STERILE IRR 500ML POUR (IV SOLUTION) ×2 IMPLANT
WRAPON POLAR PAD KNEE (MISCELLANEOUS) ×2

## 2020-11-11 NOTE — Transfer of Care (Signed)
Immediate Anesthesia Transfer of Care Note  Patient: Amber Holmes  Procedure(s) Performed: COMPUTER ASSISTED TOTAL KNEE ARTHROPLASTY -RNFA (Left: Knee)  Patient Location: PACU  Anesthesia Type:Spinal  Level of Consciousness: drowsy  Airway & Oxygen Therapy: Patient Spontanous Breathing and Patient connected to face mask oxygen  Post-op Assessment: Report given to RN  Post vital signs: stable  Last Vitals:  Vitals Value Taken Time  BP    Temp    Pulse 86 11/11/20 1049  Resp 19 11/11/20 1049  SpO2 92 % 11/11/20 1049  Vitals shown include unvalidated device data.  Last Pain:  Vitals:   11/11/20 0625  TempSrc: Temporal  PainSc: 0-No pain         Complications: No notable events documented.

## 2020-11-11 NOTE — Evaluation (Signed)
Occupational Therapy Evaluation Patient Details Name: Amber Holmes MRN: [id number] DOB: 11/16/1950 Today's Date: 11/11/2020   History of Present Illness Pt is a 70 y.o. with PMH of hypertension and R TKA.  Pt is s/p L TKA on 11/11/20.   Clinical Impression   Pt seen for OT evaluation this date in setting of acute hospitalization s/p elective L TKA. Pt reports being INDEP at baseline. She presents this date with decreased fxl activity tolerance, pain, decreased L knee ROM and some nausea, limiting her ability to participate in ADLs/ADL mobility w/o assistance. She requires CGA/SBA for ADL transfers with RW and fxl mobility. Pt demos F balance with fxl mobility, G static standing balance with UE support on RW. Pt requires MIN A for LB Dressing such as donning underwear. She tolerates session well, but does c/o some nausea that she fears might be r/t pain medication. She is sitting in restroom at end of session requesting increased time d/t continued bouts of nausea. OT provides emesis bag after having assisted with dressing tasks to remove soiled clothing. RN notified of session contents as well as pt's nausea and the status in which she was left (at her request) end of session. Will continue to follow acutely. Because pt is knowledgeable given recent R TKA and has necessary equipment at home, do not anticipate she will require OT f/u outside of acute setting.      Recommendations for follow up therapy are one component of a multi-disciplinary discharge planning process, led by the attending physician.  Recommendations may be updated based on patient status, additional functional criteria and insurance authorization.   Follow Up Recommendations  No OT follow up    Equipment Recommendations  None recommended by OT (has all necessary equipment from recent R knee replacement)    Recommendations for Other Services       Precautions / Restrictions Precautions Precautions:  Knee Restrictions Weight Bearing Restrictions: Yes LLE Weight Bearing: Weight bearing as tolerated      Mobility Bed Mobility Overal bed mobility: Modified Independent             General bed mobility comments: HOB elevated, increased time    Transfers Overall transfer level: Needs assistance Equipment used: Rolling walker (2 wheeled) Transfers: Sit to/from Stand Sit to Stand: Min guard;Supervision         General transfer comment: CGA with progress to SUPV on subseqeunt trials. Pt demos good understanding of hand palcement with RW use, demosntrating appropriate carryover from PT session    Balance Overall balance assessment: Needs assistance Sitting-balance support: No upper extremity supported;Feet supported Sitting balance-Leahy Scale: Normal     Standing balance support: Bilateral upper extremity supported Standing balance-Leahy Scale: Fair Standing balance comment: benefits from UE Support for fxl mobility, able to static stand w/o UE support (G static balance)                           ADL either performed or assessed with clinical judgement   ADL                                         General ADL Comments: requires SETUP for seated UB ADLs, MIN A for seated LB ADLs, CGA for ADL transfers and fxl mobility with RW.     Vision Patient Visual Report: No change from baseline  Perception     Praxis      Pertinent Vitals/Pain Pain Assessment: 0-10 Pain Score: 3  Pain Location: L Knee Pain Descriptors / Indicators: Aching Pain Intervention(s): Limited activity within patient's tolerance;Monitored during session;Premedicated before session     Hand Dominance Right   Extremity/Trunk Assessment Upper Extremity Assessment Upper Extremity Assessment: Overall WFL for tasks assessed   Lower Extremity Assessment Lower Extremity Assessment: LLE deficits/detail LLE Deficits / Details: expected post-op ROM limitations with  knee flexion, impeding LB ADLs, in addition, dressing somewhat inhibiting ROM       Communication Communication Communication: No difficulties   Cognition Arousal/Alertness: Awake/alert Behavior During Therapy: WFL for tasks assessed/performed Overall Cognitive Status: Within Functional Limits for tasks assessed                                     General Comments       Exercises Total Joint Exercises Ankle Circles/Pumps: AROM;Strengthening;Both;10 reps;Supine Quad Sets: AROM;Strengthening;Both;10 reps;Supine Gluteal Sets: AROM;Strengthening;Both;10 reps;Supine Heel Slides: AROM;Strengthening;Both;10 reps;Supine Hip ABduction/ADduction: AROM;Strengthening;Both;10 reps;Supine Straight Leg Raises: AROM;Strengthening;Both;10 reps;Supine Long Arc Quad: AROM;Strengthening;Both;10 reps;Seated Marching in Standing: AROM;Strengthening;Both;10 reps;Standing Other Exercises Other Exercises: OT educates pt and spouse re: polar care mgt, compression stocking mgt, safety and fall prevention considerations, bathing considerations. Both parties with good udnerstanding, but may require f/u as pt became nauseous during session which understandably limited her ability to attend to education.   Shoulder Instructions      Home Living Family/patient expects to be discharged to:: Private residence Living Arrangements: Spouse/significant other Available Help at Discharge: Family;Available 24 hours/day Type of Home: House Home Access: Ramped entrance     Home Layout: One level     Bathroom Shower/Tub: Tub/shower unit;Walk-in shower   Bathroom Toilet: Standard Bathroom Accessibility: Yes   Home Equipment: Walker - 2 wheels;Cane - single point;Bedside commode;Shower seat;Grab bars - tub/shower          Prior Functioning/Environment Level of Independence: Independent with assistive device(s)        Comments: Pt states she utilizes the cane when walking outside on uneven  surfaces. but typically ambulates without any AD.        OT Problem List: Decreased activity tolerance;Pain;Decreased range of motion      OT Treatment/Interventions: Self-care/ADL training;Therapeutic exercise;Therapeutic activities;DME and/or AE instruction    OT Goals(Current goals can be found in the care plan section) Acute Rehab OT Goals Patient Stated Goal: To go home. OT Goal Formulation: With patient/family Time For Goal Achievement: 11/25/20 Potential to Achieve Goals: Good ADL Goals Pt Will Perform Lower Body Dressing: with supervision;with adaptive equipment Pt Will Transfer to Toilet: with supervision;ambulating Pt Will Perform Toileting - Clothing Manipulation and hygiene: with supervision;sit to/from stand  OT Frequency: Min 1X/week   Barriers to D/C:            Co-evaluation              AM-PAC OT "6 Clicks" Daily Activity     Outcome Measure Help from another person eating meals?: None Help from another person taking care of personal grooming?: None Help from another person toileting, which includes using toliet, bedpan, or urinal?: A Little Help from another person bathing (including washing, rinsing, drying)?: A Little Help from another person to put on and taking off regular upper body clothing?: None Help from another person to put on and taking off regular lower body clothing?: A Little  6 Click Score: 21   End of Session Equipment Utilized During Treatment: Gait belt;Rolling walker Nurse Communication: Other (comment) (notified of pt left status as well as nausea and needing medication to be able to eat dinner)  Activity Tolerance: Patient tolerated treatment well;Other (comment) (somewhat limited by nausea) Patient left: Other (comment) (left seated on commode in restroom with emesis bag, Spouse sitting outside of bathroom and RN notified of pt situation)  OT Visit Diagnosis: Other abnormalities of gait and mobility (R26.89)                 Time: 5701-7793 OT Time Calculation (min): 42 min Charges:  OT General Charges $OT Visit: 1 Visit OT Evaluation $OT Eval Moderate Complexity: 1 Mod OT Treatments $Self Care/Home Management : 8-22 mins $Therapeutic Activity: 8-22 mins  Rejeana Brock, MS, OTR/L ascom [phone] 11/11/20, 6:46 PM

## 2020-11-11 NOTE — TOC Progression Note (Signed)
Transition of Care [facility]) - Progression Note    Patient Details  Name: Amber Holmes MRN: [id number] Date of Birth: 1950/08/21  Transition of Care [facility]) CM/SW Contact  Caryn Section, RN Phone Number: 11/11/2020, 4:12 PM  Clinical Narrative:   Patient lives with husband, who can assist if needed.  No concerns transporting to appointments or getting medications.  Patient can take medications as directed.    Patient declines DME, as she states she has DME at home.    Expected Discharge Plan: Home w Home Health Services Barriers to Discharge: Continued Medical Work up  Expected Discharge Plan and Services Expected Discharge Plan: Home w Home Health Services   Discharge Planning Services: CM Consult Post Acute Care Choice: Home Health Living arrangements for the past 2 months: Single Family Home                 DME Arranged:  (patient states she already has DME at home)         HH Arranged: PT, OT HH Agency: [facility] Date [facility] Agency Contacted: 11/11/20   Representative spoke with at [facility] Agency: [place], set up prior to admission   Social Determinants of Health (SDOH) Interventions    Readmission Risk Interventions No flowsheet data found.

## 2020-11-11 NOTE — Plan of Care (Signed)
  Problem: Education: Goal: Knowledge of General Education information will improve Description: Including pain rating scale, medication(s)/side effects and non-pharmacologic comfort measures Outcome: Progressing   Problem: Health Behavior/Discharge Planning: Goal: Ability to manage health-related needs will improve Outcome: Progressing   Problem: Clinical Measurements: Goal: Ability to maintain clinical measurements within normal limits will improve Outcome: Progressing Goal: Will remain free from infection Outcome: Progressing Goal: Diagnostic test results will improve Outcome: Progressing Goal: Respiratory complications will improve Outcome: Progressing Goal: Cardiovascular complication will be avoided Outcome: Progressing   Problem: Activity: Goal: Risk for activity intolerance will decrease Outcome: Progressing   Problem: Nutrition: Goal: Adequate nutrition will be maintained Outcome: Progressing   Problem: Coping: Goal: Level of anxiety will decrease Outcome: Progressing   Problem: Pain Managment: Goal: General experience of comfort will improve Outcome: Progressing   Problem: Safety: Goal: Ability to remain free from injury will improve Outcome: Progressing   Problem: Education: Goal: Knowledge of the prescribed therapeutic regimen will improve Outcome: Progressing Goal: Individualized Educational Video(s) Outcome: Progressing   Problem: Activity: Goal: Ability to avoid complications of mobility impairment will improve Outcome: Progressing Goal: Range of joint motion will improve Outcome: Progressing   Problem: Clinical Measurements: Goal: Postoperative complications will be avoided or minimized Outcome: Progressing

## 2020-11-11 NOTE — Evaluation (Signed)
Physical Therapy Evaluation Patient Details Name: Amber Holmes MRN: [id number] DOB: 1950-10-12 Today's Date: 11/11/2020  History of Present Illness  Pt is a 70 y.o. with PMH of hypertension and R TKA.  Pt is s/p L TKA on 11/11/20.  Clinical Impression  Pt received in Semi-Fowler's position and agreeable to therapy.   Pt familiar with expectations d/t having R knee performed approximately one year ago.  Pt able to perform bed-level exercises with good technique and is able to transfer into standing position with CGA for safety, but could use supervision only in standing.  Pt able to ambulate with good technique and fatigues after ~50 feet and returned to bed.  Pt will benefit from skilled PT intervention to increase independence and safety with basic mobility in preparation for discharge to the venue listed below.          Recommendations for follow up therapy are one component of a multi-disciplinary discharge planning process, led by the attending physician.  Recommendations may be updated based on patient status, additional functional criteria and insurance authorization.  Follow Up Recommendations Home health PT    Equipment Recommendations  None recommended by PT    Recommendations for Other Services       Precautions / Restrictions Precautions Precautions: Knee Precaution Booklet Issued: No Restrictions Weight Bearing Restrictions: Yes LLE Weight Bearing: Weight bearing as tolerated      Mobility  Bed Mobility Overal bed mobility: Modified Independent             General bed mobility comments: Extra time and use of bedrails necessary.    Transfers Overall transfer level: Needs assistance Equipment used: Rolling walker (2 wheeled) Transfers: Sit to/from Stand Sit to Stand: Min guard         General transfer comment: Good use of FWW and recalls needing to push up from bed initially.  Ambulation/Gait Ambulation/Gait assistance: Min guard Gait Distance  (Feet): 100 Feet Assistive device: Rolling walker (2 wheeled) Gait Pattern/deviations: Step-through pattern;Decreased step length - right;Decreased stance time - left;Decreased stride length Gait velocity: Decreased   General Gait Details: Pt ambulates with slow, but controlled mobility.  Stairs            Wheelchair Mobility    Modified Rankin (Stroke Patients Only)       Balance Overall balance assessment: Needs assistance Sitting-balance support: No upper extremity supported;Feet supported Sitting balance-Leahy Scale: Normal     Standing balance support: Bilateral upper extremity supported Standing balance-Leahy Scale: Good                               Pertinent Vitals/Pain Pain Assessment: 0-10 Pain Score: 2  Pain Location: L Knee Pain Descriptors / Indicators: Aching Pain Intervention(s): Limited activity within patient's tolerance;Premedicated before session;Repositioned;Ice applied    Home Living Family/patient expects to be discharged to:: Private residence Living Arrangements: Spouse/significant other Available Help at Discharge: Family;Available 24 hours/day Type of Home: House Home Access: Ramped entrance     Home Layout: One level Home Equipment: Walker - 2 wheels;Cane - single point;Bedside commode;Shower seat;Grab bars - tub/shower      Prior Function Level of Independence: Independent with assistive device(s)         Comments: Pt states she utilizes the cane when walking outside on uneven surfaces. but typically ambulates without any AD.     Hand Dominance   Dominant Hand: Right    Extremity/Trunk Assessment   Upper  Extremity Assessment Upper Extremity Assessment: Overall WFL for tasks assessed    Lower Extremity Assessment Lower Extremity Assessment: LLE deficits/detail LLE Deficits / Details: S/P TKA       Communication   Communication: No difficulties  Cognition Arousal/Alertness: Awake/alert Behavior During  Therapy: WFL for tasks assessed/performed Overall Cognitive Status: Within Functional Limits for tasks assessed                                        General Comments      Exercises Total Joint Exercises Ankle Circles/Pumps: AROM;Strengthening;Both;10 reps;Supine Quad Sets: AROM;Strengthening;Both;10 reps;Supine Gluteal Sets: AROM;Strengthening;Both;10 reps;Supine Heel Slides: AROM;Strengthening;Both;10 reps;Supine Hip ABduction/ADduction: AROM;Strengthening;Both;10 reps;Supine Straight Leg Raises: AROM;Strengthening;Both;10 reps;Supine Long Arc Quad: AROM;Strengthening;Both;10 reps;Seated Marching in Standing: AROM;Strengthening;Both;10 reps;Standing Other Exercises Other Exercises: Pt and husband educated on what to expect with TKR and the benefits of exercising during hospital stay.  Pt also educated on the roles of PT and services provided during hospital stay.   Assessment/Plan    PT Assessment Patient needs continued PT services  PT Problem List Decreased strength;Decreased range of motion;Decreased activity tolerance;Decreased mobility       PT Treatment Interventions      PT Goals (Current goals can be found in the Care Plan section)  Acute Rehab PT Goals Patient Stated Goal: To go home. PT Goal Formulation: With patient/family Time For Goal Achievement: 11/25/20 Potential to Achieve Goals: Good    Frequency BID   Barriers to discharge        Co-evaluation               AM-PAC PT "6 Clicks" Mobility  Outcome Measure Help needed turning from your back to your side while in a flat bed without using bedrails?: A Little Help needed moving from lying on your back to sitting on the side of a flat bed without using bedrails?: A Little Help needed moving to and from a bed to a chair (including a wheelchair)?: A Little Help needed standing up from a chair using your arms (e.g., wheelchair or bedside chair)?: A Little Help needed to walk in  hospital room?: A Little Help needed climbing 3-5 steps with a railing? : A Lot 6 Click Score: 17    End of Session Equipment Utilized During Treatment: Gait belt Activity Tolerance: Patient tolerated treatment well Patient left: in bed;with call bell/phone within reach;with family/visitor present;with SCD's reapplied Nurse Communication: Mobility status PT Visit Diagnosis: Unsteadiness on feet (R26.81);Other abnormalities of gait and mobility (R26.89);Muscle weakness (generalized) (M62.81)    Time: 1316-1400 PT Time Calculation (min) (ACUTE ONLY): 44 min   Charges:   PT Evaluation $PT Eval Low Complexity: 1 Low PT Treatments $Gait Training: 8-22 mins $Therapeutic Exercise: 8-22 mins        Nolon Bussing, PT, DPT 11/11/20, 2:56 PM   Phineas Real 11/11/2020, 2:52 PM

## 2020-11-11 NOTE — Anesthesia Procedure Notes (Signed)
Spinal  Patient location during procedure: OR Start time: 11/11/2020 7:18 AM Reason for block: surgical anesthesia Staffing Performed: resident/CRNA  Preanesthetic Checklist Completed: patient identified, IV checked, site marked, risks and benefits discussed, surgical consent, monitors and equipment checked, pre-op evaluation and timeout performed Spinal Block Patient position: sitting Prep: DuraPrep Patient monitoring: heart rate, cardiac monitor, continuous pulse ox and blood pressure Approach: midline Location: L2-3 Injection technique: single-shot Needle Needle type: Sprotte  Needle gauge: 24 G Needle length: 9 cm Assessment Sensory level: T4 Events: CSF return Additional Notes Negative heme, paresthesia, no pain with injection.  Good free flow CSF pre/post injection

## 2020-11-11 NOTE — H&P (Signed)
The patient has been re-examined, and the chart reviewed, and there have been no interval changes to the documented history and physical.    The risks, benefits, and alternatives have been discussed at length. The patient expressed understanding of the risks benefits and agreed with plans for surgical intervention.  Jaxzen Vanhorn P. Kamaury Cutbirth, Jr. M.D.    

## 2020-11-11 NOTE — Op Note (Signed)
OPERATIVE NOTE  DATE OF SURGERY:  11/11/2020  PATIENT NAME:  Amber Holmes   DOB: 08/29/1950  MRN: [id number]  PRE-OPERATIVE DIAGNOSIS: Degenerative arthrosis of the left knee, primary  POST-OPERATIVE DIAGNOSIS:  Same  PROCEDURE:  Left total knee arthroplasty using computer-assisted navigation  SURGEON:  Jena Gauss. M.D.  ANESTHESIA: spinal  ESTIMATED BLOOD LOSS: 50 mL  FLUIDS REPLACED: 1000 mL of crystalloid  TOURNIQUET TIME: 84 minutes  DRAINS: 2 medium Hemovac drains  SOFT TISSUE RELEASES: Anterior cruciate ligament, posterior cruciate ligament, deep medial collateral ligament, patellofemoral ligament  IMPLANTS UTILIZED: DePuy Attune size 5N posterior stabilized femoral component (cemented), size 4 rotating platform tibial component (cemented), 35 mm medialized dome patella (cemented), and a 6 mm stabilized rotating platform polyethylene insert.  INDICATIONS FOR SURGERY: Jaelyn Holmes is a 70 y.o. year old female with a long history of progressive knee pain. X-rays demonstrated severe degenerative changes in tricompartmental fashion. The patient had not seen any significant improvement despite conservative nonsurgical intervention. After discussion of the risks and benefits of surgical intervention, the patient expressed understanding of the risks benefits and agree with plans for total knee arthroplasty.   The risks, benefits, and alternatives were discussed at length including but not limited to the risks of infection, bleeding, nerve injury, stiffness, blood clots, the need for revision surgery, cardiopulmonary complications, among others, and they were willing to proceed.  PROCEDURE IN DETAIL: The patient was brought into the operating room and, after adequate spinal anesthesia was achieved, a tourniquet was placed on the patient's upper thigh. The patient's knee and leg were cleaned and prepped with alcohol and DuraPrep and draped in the usual sterile fashion. A  "timeout" was performed as per usual protocol. The lower extremity was exsanguinated using an Esmarch, and the tourniquet was inflated to 300 mmHg. An anterior longitudinal incision was made followed by a standard mid vastus approach. The deep fibers of the medial collateral ligament were elevated in a subperiosteal fashion off of the medial flare of the tibia so as to maintain a continuous soft tissue sleeve. The patella was subluxed laterally and the patellofemoral ligament was incised. Inspection of the knee demonstrated severe degenerative changes with full-thickness loss of articular cartilage. Osteophytes were debrided using a rongeur. Anterior and posterior cruciate ligaments were excised. Two 4.0 mm Schanz pins were inserted in the femur and into the tibia for attachment of the array of trackers used for computer-assisted navigation. Hip center was identified using a circumduction technique. Distal landmarks were mapped using the computer. The distal femur and proximal tibia were mapped using the computer. The distal femoral cutting guide was positioned using computer-assisted navigation so as to achieve a 5 distal valgus cut. The femur was sized and it was felt that a size 5N femoral component was appropriate. A size 5 femoral cutting guide was positioned and the anterior cut was performed and verified using the computer. This was followed by completion of the posterior and chamfer cuts. Femoral cutting guide for the central box was then positioned in the center box cut was performed.  Attention was then directed to the proximal tibia. Medial and lateral menisci were excised. The extramedullary tibial cutting guide was positioned using computer-assisted navigation so as to achieve a 0 varus-valgus alignment and 3 posterior slope. The cut was performed and verified using the computer. The proximal tibia was sized and it was felt that a size 4 tibial tray was appropriate. Tibial and femoral trials were  inserted followed by insertion  of a 6 mm polyethylene insert. This allowed for excellent mediolateral soft tissue balancing both in flexion and in full extension. Finally, the patella was cut and prepared so as to accommodate a 35 mm medialized dome patella. A patella trial was placed and the knee was placed through a range of motion with excellent patellar tracking appreciated. The femoral trial was removed after debridement of posterior osteophytes. The central post-hole for the tibial component was reamed followed by insertion of a keel punch. Tibial trials were then removed. Cut surfaces of bone were irrigated with copious amounts of normal saline using pulsatile lavage and then suctioned dry. Polymethylmethacrylate cement was prepared in the usual fashion using a vacuum mixer. Cement was applied to the cut surface of the proximal tibia as well as along the undersurface of a size 4 rotating platform tibial component. Tibial component was positioned and impacted into place. Excess cement was removed using Personal assistant. Cement was then applied to the cut surfaces of the femur as well as along the posterior flanges of the size 5N femoral component. The femoral component was positioned and impacted into place. Excess cement was removed using Personal assistant. A 5 mm polyethylene trial was inserted and the knee was brought into full extension with steady axial compression applied. Finally, cement was applied to the backside of a 35 mm medialized dome patella and the patellar component was positioned and patellar clamp applied. Excess cement was removed using Personal assistant. After adequate curing of the cement, the tourniquet was deflated after a total tourniquet time of 84 minutes. Hemostasis was achieved using electrocautery. The knee was irrigated with copious amounts of normal saline using pulsatile lavage followed by 500 ml of Surgiphor and then suctioned dry. 20 mL of 1.3% Exparel and 60 mL of 0.25% Marcaine  in 40 mL of normal saline was injected along the posterior capsule, medial and lateral gutters, and along the arthrotomy site. A 6 mm stabilized rotating platform polyethylene insert was inserted and the knee was placed through a range of motion with excellent mediolateral soft tissue balancing appreciated and excellent patellar tracking noted. 2 medium drains were placed in the wound bed and brought out through separate stab incisions. The medial parapatellar portion of the incision was reapproximated using interrupted sutures of #1 Vicryl. Subcutaneous tissue was approximated in layers using first #0 Vicryl followed #2-0 Vicryl. The skin was approximated with skin staples. A sterile dressing was applied.  The patient tolerated the procedure well and was transported to the recovery room in stable condition.    Manda Holstad P. Angie Fava., M.D.

## 2020-11-11 NOTE — TOC Progression Note (Signed)
Transition of Care [facility]) - Progression Note    Patient Details  Name: Amber Holmes MRN: [id number] Date of Birth: 02/24/1950  Transition of Care [facility]) CM/SW Contact  Marlowe Sax, RN Phone Number: 11/11/2020, 4:02 PM  Clinical Narrative:     Dr Ernest Pine called and stated that he sent over to [facility] orders last week, The patient has had [facility] in the past and wants them again, I spoke with Cindie at [facility] and confirmed they will accept they patient       Expected Discharge Plan and Services                                                 Social Determinants of Health (SDOH) Interventions    Readmission Risk Interventions No flowsheet data found.

## 2020-11-11 NOTE — Progress Notes (Signed)
ORTHOPAEDICS: The patient had a previous right total knee arthroplasty and received home health services from [place]. She has requested the same [facility] agency and also requested Tressia Danas, PT to be her therapist.  I submitted a prescription for [facility] PT to [facility] preoperatively.  Gianny Sabino P. Angie Fava M.D.

## 2020-11-11 NOTE — Anesthesia Preprocedure Evaluation (Signed)
Anesthesia Evaluation  Patient identified by MRN, date of birth, ID band Patient awake    Reviewed: Allergy & Precautions, NPO status , Patient's Chart, lab work & pertinent test results  History of Anesthesia Complications Negative for: history of anesthetic complications  Airway Mallampati: II  TM Distance: >3 FB Neck ROM: Full    Dental  (+) Edentulous Upper, Edentulous Lower   Pulmonary neg sleep apnea, pneumonia, neg COPD, Patient abstained from smoking.Not current smoker,    Pulmonary exam normal        Cardiovascular Exercise Tolerance: Good METShypertension, (-) CAD and (-) Past MI Normal cardiovascular exam(-) dysrhythmias  - Systolic murmurs    Neuro/Psych  Headaches, negative psych ROS   GI/Hepatic neg GERD  ,(+)     (-) substance abuse  ,   Endo/Other  neg diabetes  Renal/GU Renal disease     Musculoskeletal  (+) Arthritis ,   Abdominal (+) + obese,   Peds  Hematology   Anesthesia Other Findings Past Medical History: No date: History of kidney stones No date: Hypertension No date: Kidney stones  BMI    Body Mass Index: 40.41 kg/m     Reproductive/Obstetrics                             Anesthesia Physical  Anesthesia Plan  ASA: 3  Anesthesia Plan: General/Spinal and Spinal   Post-op Pain Management:    Induction: Intravenous  PONV Risk Score and Plan: 3 and Ondansetron, Dexamethasone, Propofol infusion, TIVA and Midazolam  Airway Management Planned: Natural Airway  Additional Equipment: None  Intra-op Plan:   Post-operative Plan:   Informed Consent: I have reviewed the patients History and Physical, chart, labs and discussed the procedure including the risks, benefits and alternatives for the proposed anesthesia with the patient or authorized representative who has indicated his/her understanding and acceptance.       Plan Discussed with: CRNA and  Surgeon  Anesthesia Plan Comments: (Discussed R/B/A of neuraxial anesthesia technique with patient: - rare risks of spinal/epidural hematoma, nerve damage, infection - Risk of PDPH - Risk of nausea and vomiting - Risk of conversion to general anesthesia and its associated risks, including sore throat, damage to lips/teeth/oropharynx, and rare risks such as cardiac and respiratory events.  Patient voiced understanding.)        Anesthesia Quick Evaluation

## 2020-11-12 DIAGNOSIS — M1712 Unilateral primary osteoarthritis, left knee: Secondary | ICD-10-CM | POA: Diagnosis not present

## 2020-11-12 MED ORDER — CELECOXIB 200 MG PO CAPS: 200.0000 mg | ORAL_CAPSULE | Freq: Two times a day (BID) | ORAL | 0 refills | Status: AC

## 2020-11-12 MED ORDER — ENOXAPARIN SODIUM 40 MG/0.4ML IJ SOSY: 40.0000 mg | PREFILLED_SYRINGE | INTRAMUSCULAR | 0 refills | Status: AC

## 2020-11-12 MED ORDER — OXYCODONE HCL 5 MG PO TABS: 5.0000 mg | ORAL_TABLET | ORAL | 0 refills | Status: AC | PRN

## 2020-11-12 MED ORDER — TRAMADOL HCL 50 MG PO TABS: 50.0000 mg | ORAL_TABLET | ORAL | 0 refills | Status: AC | PRN

## 2020-11-12 NOTE — Discharge Summary (Signed)
Physician Discharge Summary  Patient ID: Amber Holmes MRN: [id number] DOB/AGE: Apr 27, 1950 70 y.o.  Admit date: 11/11/2020 Discharge date: 11/12/2020  Admission Diagnoses:  Total knee replacement status [Z96.659]  Surgeries:Procedure(s):  Left total knee arthroplasty using computer-assisted navigation   SURGEON:  Jena Gauss. M.D.   ANESTHESIA: spinal   ESTIMATED BLOOD LOSS: 50 mL   FLUIDS REPLACED: 1000 mL of crystalloid   TOURNIQUET TIME: 84 minutes   DRAINS: 2 medium Hemovac drains   SOFT TISSUE RELEASES: Anterior cruciate ligament, posterior cruciate ligament, deep medial collateral ligament, patellofemoral ligament   IMPLANTS UTILIZED: DePuy Attune size 5N posterior stabilized femoral component (cemented), size 4 rotating platform tibial component (cemented), 35 mm medialized dome patella (cemented), and a 6 mm stabilized rotating platform polyethylene insert.  Discharge Diagnoses: Patient Active Problem List   Diagnosis Date Noted   Total knee replacement status 11/11/2020   Osteoporosis 01/22/2020   Vitamin D deficiency 01/22/2020   Status post total right knee replacement 12/04/2019   Primary osteoarthritis of left knee 07/27/2019   Nephrolithiasis 11/02/2018   History of herpes zoster 04/02/2016   Vertigo 03/17/2015    Past Medical History:  Diagnosis Date   Arthritis    Headache    before menopause   History of kidney stones    Hypertension    Kidney stones    Pneumonia      Transfusion:    Consultants (if any):   Discharged Condition: Improved  Hospital Course: Amber Holmes is an 70 y.o. female who was admitted 11/11/2020 with a diagnosis of left knee osteoarthritis and went to the operating room on 11/11/2020 and underwent left total knee arthoplasty. The patient received perioperative antibiotics for prophylaxis (see below). The patient tolerated the procedure well and was transported to PACU in stable condition. After meeting PACU  criteria, the patient was subsequently transferred to the Orthopaedics/Rehabilitation unit.   The patient received DVT prophylaxis in the form of early mobilization, Lovenox, Foot Pumps, and TED hose. A sacral pad had been placed and heels were elevated off of the bed with rolled towels in order to protect skin integrity. Foley catheter was discontinued on postoperative day #0. Wound drains were discontinued on postoperative day #1. The surgical incision was healing well without signs of infection.  Physical therapy was initiated postoperatively for transfers, gait training, and strengthening. Occupational therapy was initiated for activities of daily living and evaluation for assisted devices. Rehabilitation goals were reviewed in detail with the patient. The patient made steady progress with physical therapy and physical therapy recommended discharge to Home.   The patient achieved the preliminary goals of this hospitalization and was felt to be medically and orthopaedically appropriate for discharge.  She was given perioperative antibiotics:  Anti-infectives (From admission, onward)    Start     Dose/Rate Route Frequency Ordered Stop   11/11/20 1130  ceFAZolin (ANCEF) IVPB 2g/100 mL premix        2 g 200 mL/hr over 30 Minutes Intravenous Every 6 hours 11/11/20 1128 11/11/20 1915   11/11/20 0634  ceFAZolin (ANCEF) 2-4 GM/100ML-% IVPB       Note to Pharmacy: Agnes Lawrence  : cabinet override      11/11/20 0634 11/11/20 0738   11/11/20 0600  ceFAZolin (ANCEF) IVPB 2g/100 mL premix        2 g 200 mL/hr over 30 Minutes Intravenous On call to O.R. 11/11/20 0225 11/11/20 0737     .  Recent vital signs:  Vitals:  11/12/20 0509 11/12/20 0742  BP: 136/70 (!) 152/73  Pulse: 64 60  Resp: 16 16  Temp: 98 F (36.7 C) 98 F (36.7 C)  SpO2: 96% 97%    Recent laboratory studies:  No results for input(s): WBC, HGB, HCT, PLT, K, CL, CO2, BUN, CREATININE, GLUCOSE, CALCIUM, LABPT, INR in  the last 72 hours.  Diagnostic Studies: DG Knee Left Port  Result Date: 11/11/2020 CLINICAL DATA:  Total knee replacement, left. EXAM: PORTABLE LEFT KNEE - 1-2 VIEW COMPARISON:  None. FINDINGS: 1100 hours. Patient is status post left total knee arthroplasty. The hardware is intact and appears well positioned. No evidence of acute fracture or dislocation. A surgical drain is in place. There is gas in the joint and anterior soft tissues. Anterior skin staples are present. IMPRESSION: No demonstrated complication following left total knee arthroplasty. Electronically Signed   By: Carey Bullocks M.D.   On: 11/11/2020 11:30    Discharge Medications:   Allergies as of 11/12/2020       Reactions   Penicillins Swelling, Rash   IgE = 82 (WNL) on 11/27/2019 Whites in eyes started swelling Did it involve swelling of the face/tongue/throat, SOB, or low BP? Yes Did it involve sudden or severe rash/hives, skin peeling, or any reaction on the inside of your mouth or nose? No Did you need to seek medical attention at a hospital or doctor's office? Yes When did it last happen?      10 Years If all above answers are "NO", may proceed with cephalosporin use.        Medication List     STOP taking these medications    aspirin 81 MG chewable tablet   Ibuprofen 200 MG Caps       TAKE these medications    acetaminophen 500 MG tablet Commonly known as: TYLENOL Take 1,000 mg by mouth every 6 (six) hours as needed for headache.   amLODipine 5 MG tablet Commonly known as: NORVASC Take 5 mg by mouth daily.   celecoxib 200 MG capsule Commonly known as: CELEBREX Take 1 capsule (200 mg total) by mouth 2 (two) times daily.   cetirizine 10 MG tablet Commonly known as: ZYRTEC Take 10 mg by mouth.   diclofenac Sodium 1 % Gel Commonly known as: VOLTAREN Apply 2 g topically 4 (four) times daily as needed.   docusate sodium 100 MG capsule Commonly known as: COLACE Take 100 mg by mouth daily.    doxycycline 100 MG tablet Commonly known as: VIBRA-TABS Take 100 mg by mouth 2 (two) times daily.   ELDERBERRY PO Take 2 tablets by mouth daily.   enoxaparin 40 MG/0.4ML injection Commonly known as: LOVENOX Inject 0.4 mLs (40 mg total) into the skin daily for 14 days.   Osteo Bi-Flex Adv Triple St Tabs Take 1 tablet by mouth 2 (two) times a day.   oxyCODONE 5 MG immediate release tablet Commonly known as: Oxy IR/ROXICODONE Take 1 tablet (5 mg total) by mouth every 4 (four) hours as needed for severe pain.   traMADol 50 MG tablet Commonly known as: ULTRAM Take 1 tablet (50 mg total) by mouth every 4 (four) hours as needed for moderate pain.   Vitamin D-3 25 MCG (1000 UT) Caps Take 1,000 Units by mouth daily.               Durable Medical Equipment  (From admission, onward)           Start     Ordered  11/11/20 1129  DME Walker rolling  Once       Question:  Patient needs a walker to treat with the following condition  Answer:  Total knee replacement status   11/11/20 1128   11/11/20 1129  DME Bedside commode  Once       Question:  Patient needs a bedside commode to treat with the following condition  Answer:  Total knee replacement status   11/11/20 1128            Disposition: Home with home health PT     Follow-up Information     Madelyn Flavors, PA-C Follow up on 11/28/2020.   Specialty: Orthopedic Surgery Why: at 9:45am Contact information: [address] [facility] [place] [address] [phone]         Donato Heinz, MD Follow up on 01/03/2021.   Specialty: Orthopedic Surgery Why: at 2:45pm Contact information: [address] [facility] [address] [facility] [place] [address] [phone]                  Lasandra Beech, PA-C 11/12/2020, 10:07 AM

## 2020-11-12 NOTE — Anesthesia Postprocedure Evaluation (Signed)
Anesthesia Post Note  Patient: Amber Holmes  Procedure(s) Performed: COMPUTER ASSISTED TOTAL KNEE ARTHROPLASTY -RNFA (Left: Knee)  Anesthesia Type: Spinal Anesthetic complications: no Comments: Patient discharged prior to postoperative evaluation.    No notable events documented.   Last Vitals:  Vitals:   11/12/20 0509 11/12/20 0742  BP: 136/70 (!) 152/73  Pulse: 64 60  Resp: 16 16  Temp: 36.7 C 36.7 C  SpO2: 96% 97%    Last Pain:  Vitals:   11/12/20 0314  TempSrc:   PainSc: 0-No pain                 Johny Drilling

## 2020-11-12 NOTE — TOC Transition Note (Addendum)
Transition of Care [facility]) - CM/SW Discharge Note   Patient Details  Name: Angeliah Wisdom MRN: [id number] Date of Birth: 09/04/1950  Transition of Care [facility]) CM/SW Contact:  Gildardo Griffes, LCSW Phone Number: 11/12/2020, 10:22 AM   Clinical Narrative:     Patient to discharge home with home health services today, patient is set up with [facility]. Cindy  with [facility] informed of patient's discharge. HH orders were previously put in. No DME needs identified.   No other discharge needs at this time. CSW signing off.    Final next level of care: Home w Home Health Services Barriers to Discharge: No Barriers Identified   Patient Goals and CMS Choice Patient states their goals for this hospitalization and ongoing recovery are:: to go home CMS Medicare.gov Compare Post Acute Care list provided to:: Patient Choice offered to / list presented to : Patient  Discharge Placement                    Patient and family notified of of transfer: 11/12/20  Discharge Plan and Services   Discharge Planning Services: CM Consult Post Acute Care Choice: Home Health          DME Arranged:  (patient states she already has DME at home)         [facility] Arranged: PT HH Agency: [facility] Date [facility] Agency Contacted: 11/12/20 Time HH Agency Contacted: 1022 Representative spoke with at [facility] Agency: Kandee Keen  Social Determinants of Health (SDOH) Interventions     Readmission Risk Interventions No flowsheet data found.

## 2020-11-12 NOTE — Progress Notes (Signed)
  Subjective: 1 Day Post-Op Procedure(s) (LRB): COMPUTER ASSISTED TOTAL KNEE ARTHROPLASTY -RNFA (Left) Patient reports pain as well-controlled.   Patient is well, and has had no acute complaints or problems Plan is to go Home after hospital stay. Negative for chest pain and shortness of breath Fever: no Gastrointestinal: negative for nausea and vomiting.  Patient has not had a bowel movement.  Objective: Vital signs in last 24 hours: Temp:  [97.5 F (36.4 C)-98.7 F (37.1 C)] 98 F (36.7 C) (10/15 0742) Pulse Rate:  [60-86] 60 (10/15 0742) Resp:  [9-19] 16 (10/15 0742) BP: (115-152)/(61-76) 152/73 (10/15 0742) SpO2:  [92 %-97 %] 97 % (10/15 0742)  Intake/Output from previous day:  Intake/Output Summary (Last 24 hours) at 11/12/2020 1000 Last data filed at 11/11/2020 1600 Gross per 24 hour  Intake 500 ml  Output 70 ml  Net 430 ml    Intake/Output this shift: No intake/output data recorded.  Labs: No results for input(s): HGB in the last 72 hours. No results for input(s): WBC, RBC, HCT, PLT in the last 72 hours. No results for input(s): NA, K, CL, CO2, BUN, CREATININE, GLUCOSE, CALCIUM in the last 72 hours. No results for input(s): LABPT, INR in the last 72 hours.   EXAM General - Patient is Alert, Appropriate, and Oriented Extremity - Neurovascular intact Dorsiflexion/Plantar flexion intact Compartment soft Dressing/Incision -Postoperative dressing remains in place., Polar Care in place and working. , Hemovac in place. , Following removal of post-op dressing, mild sanguinous drainage noted distally, no active drainage  Motor Function - intact, moving foot and toes well on exam. Able to perform independent SLR.  Cardiovascular- Regular rate and rhythm, no murmurs/rubs/gallops Respiratory- Lungs clear to auscultation bilaterally Gastrointestinal- soft, nontender, and active bowel sounds   Assessment/Plan: 1 Day Post-Op Procedure(s) (LRB): COMPUTER ASSISTED TOTAL  KNEE ARTHROPLASTY -RNFA (Left) Active Problems:   Total knee replacement status  Estimated body mass index is 40.41 kg/m as calculated from the following:   Height as of this encounter: 5\' 1"  (1.549 m).   Weight as of this encounter: 97 kg. Advance diet Up with therapy  Discharge home with [facility] PT with [facility]. Patient states they have already contacted her and will see her Monday  Post-op dressing removed. , Hemovac removed., Mini compression dressing applied. , and Fresh honeycomb dressing applied.   DVT Prophylaxis - Lovenox, Ted hose, and foot pumps Weight-Bearing as tolerated to left leg  Sunday, PA-C [facility] Orthopaedic Surgery 11/12/2020, 10:00 AM

## 2020-11-12 NOTE — Plan of Care (Signed)
  Problem: Education: Goal: Knowledge of General Education information will improve Description: Including pain rating scale, medication(s)/side effects and non-pharmacologic comfort measures Outcome: Progressing   Problem: Health Behavior/Discharge Planning: Goal: Ability to manage health-related needs will improve Outcome: Progressing   Problem: Clinical Measurements: Goal: Ability to maintain clinical measurements within normal limits will improve Outcome: Progressing Goal: Will remain free from infection Outcome: Progressing Goal: Diagnostic test results will improve Outcome: Progressing Goal: Respiratory complications will improve Outcome: Progressing Goal: Cardiovascular complication will be avoided Outcome: Progressing   Problem: Activity: Goal: Risk for activity intolerance will decrease Outcome: Progressing   Problem: Nutrition: Goal: Adequate nutrition will be maintained Outcome: Progressing   Problem: Coping: Goal: Level of anxiety will decrease Outcome: Progressing   Problem: Elimination: Goal: Will not experience complications related to bowel motility Outcome: Progressing Goal: Will not experience complications related to urinary retention Outcome: Progressing   Problem: Pain Managment: Goal: General experience of comfort will improve Outcome: Progressing   Problem: Safety: Goal: Ability to remain free from injury will improve Outcome: Progressing   Problem: Skin Integrity: Goal: Risk for impaired skin integrity will decrease Outcome: Progressing   Problem: Education: Goal: Knowledge of the prescribed therapeutic regimen will improve Outcome: Progressing Goal: Individualized Educational Video(s) Outcome: Progressing   Problem: Clinical Measurements: Goal: Postoperative complications will be avoided or minimized Outcome: Progressing   Problem: Skin Integrity: Goal: Will show signs of wound healing Outcome: Progressing

## 2020-11-12 NOTE — Progress Notes (Signed)
Physical Therapy Treatment Patient Details Name: Amber Holmes MRN: [id number] DOB: Jun 06, 1950 Today's Date: 11/12/2020   History of Present Illness Pt is a 70 y.o. with PMH of hypertension and R TKA.  Pt is s/p L TKA on 11/11/20.    PT Comments    Pt was sitting in recliner, eager for PT session and hopeful for DC home. She was easily and safely able to stand, ambulate 200 ft, and perform stairs with RW + without safety concerns. Pt has had prior TRK ~ 6 months prior and has good insight of deficits and knowledge of expectations going forward. Was able to tolerate bone foam and ROM exercises. Pt is cleared from a PT standpoint for safe DC home with HHPT to follow.    Recommendations for follow up therapy are one component of a multi-disciplinary discharge planning process, led by the attending physician.  Recommendations may be updated based on patient status, additional functional criteria and insurance authorization.  Follow Up Recommendations  Home health PT     Equipment Recommendations  None recommended by PT       Precautions / Restrictions Precautions Precautions: Knee Precaution Booklet Issued: No Restrictions Weight Bearing Restrictions: Yes LLE Weight Bearing: Weight bearing as tolerated     Mobility  Bed Mobility Overal bed mobility: Modified Independent     Transfers Overall transfer level: Needs assistance Equipment used: Rolling walker (2 wheeled) Transfers: Sit to/from Stand Sit to Stand: Supervision     Ambulation/Gait Ambulation/Gait assistance: Supervision Gait Distance (Feet): 200 Feet Assistive device: Rolling walker (2 wheeled) Gait Pattern/deviations: Step-through pattern Gait velocity: Decreased   General Gait Details: Pt was safely able to ambulate 200 ft with supervision   Stairs Stairs: Yes Stairs assistance: Supervision Stair Management: Two rails;Step to pattern;Forwards Number of Stairs: 4 General stair comments: safely performed  ascending/descending stairs without assitance or safety concerns     Balance Overall balance assessment: Needs assistance Sitting-balance support: No upper extremity supported;Feet supported Sitting balance-Leahy Scale: Normal     Standing balance support: Bilateral upper extremity supported Standing balance-Leahy Scale: Good Standing balance comment: no balance deficits present       Cognition Arousal/Alertness: Awake/alert Behavior During Therapy: WFL for tasks assessed/performed Overall Cognitive Status: Within Functional Limits for tasks assessed      General Comments: Pt A and O x 4         General Comments General comments (skin integrity, edema, etc.): HEP reviewed. pt has had previous TKR and did extremely well.      Pertinent Vitals/Pain Pain Assessment: 0-10 Pain Score: 3  Pain Location: L Knee Pain Descriptors / Indicators: Aching Pain Intervention(s): Limited activity within patient's tolerance;Monitored during session;Premedicated before session;Repositioned     PT Goals (current goals can now be found in the care plan section) Acute Rehab PT Goals Patient Stated Goal: go home Progress towards PT goals: Progressing toward goals    Frequency    BID      PT Plan Current plan remains appropriate       AM-PAC PT "6 Clicks" Mobility   Outcome Measure  Help needed turning from your back to your side while in a flat bed without using bedrails?: None Help needed moving from lying on your back to sitting on the side of a flat bed without using bedrails?: None Help needed moving to and from a bed to a chair (including a wheelchair)?: None Help needed standing up from a chair using your arms (e.g., wheelchair or bedside chair)?:  A Little Help needed to walk in hospital room?: A Little Help needed climbing 3-5 steps with a railing? : A Little 6 Click Score: 21    End of Session Equipment Utilized During Treatment: Gait belt Activity Tolerance:  Patient tolerated treatment well Patient left: in bed;with call bell/phone within reach;with bed alarm set Nurse Communication: Mobility status PT Visit Diagnosis: Unsteadiness on feet (R26.81);Other abnormalities of gait and mobility (R26.89);Muscle weakness (generalized) (M62.81)     Time: 5035-4656 PT Time Calculation (min) (ACUTE ONLY): 21 min  Charges:  $Gait Training: 8-22 mins                     Jetta Lout PTA 11/12/20, 9:43 AM

## 2020-12-15 ENCOUNTER — Encounter: Payer: Self-pay | Admitting: Orthopedic Surgery

## 2023-07-31 IMAGING — DX DG KNEE 1-2V PORT*L*
2 series · 2 of 2 positions shown · non-contrast
Comparison: None.

CLINICAL DATA: Total knee replacement, left.

EXAM:
PORTABLE LEFT KNEE - 1-2 VIEW

[knee ap]
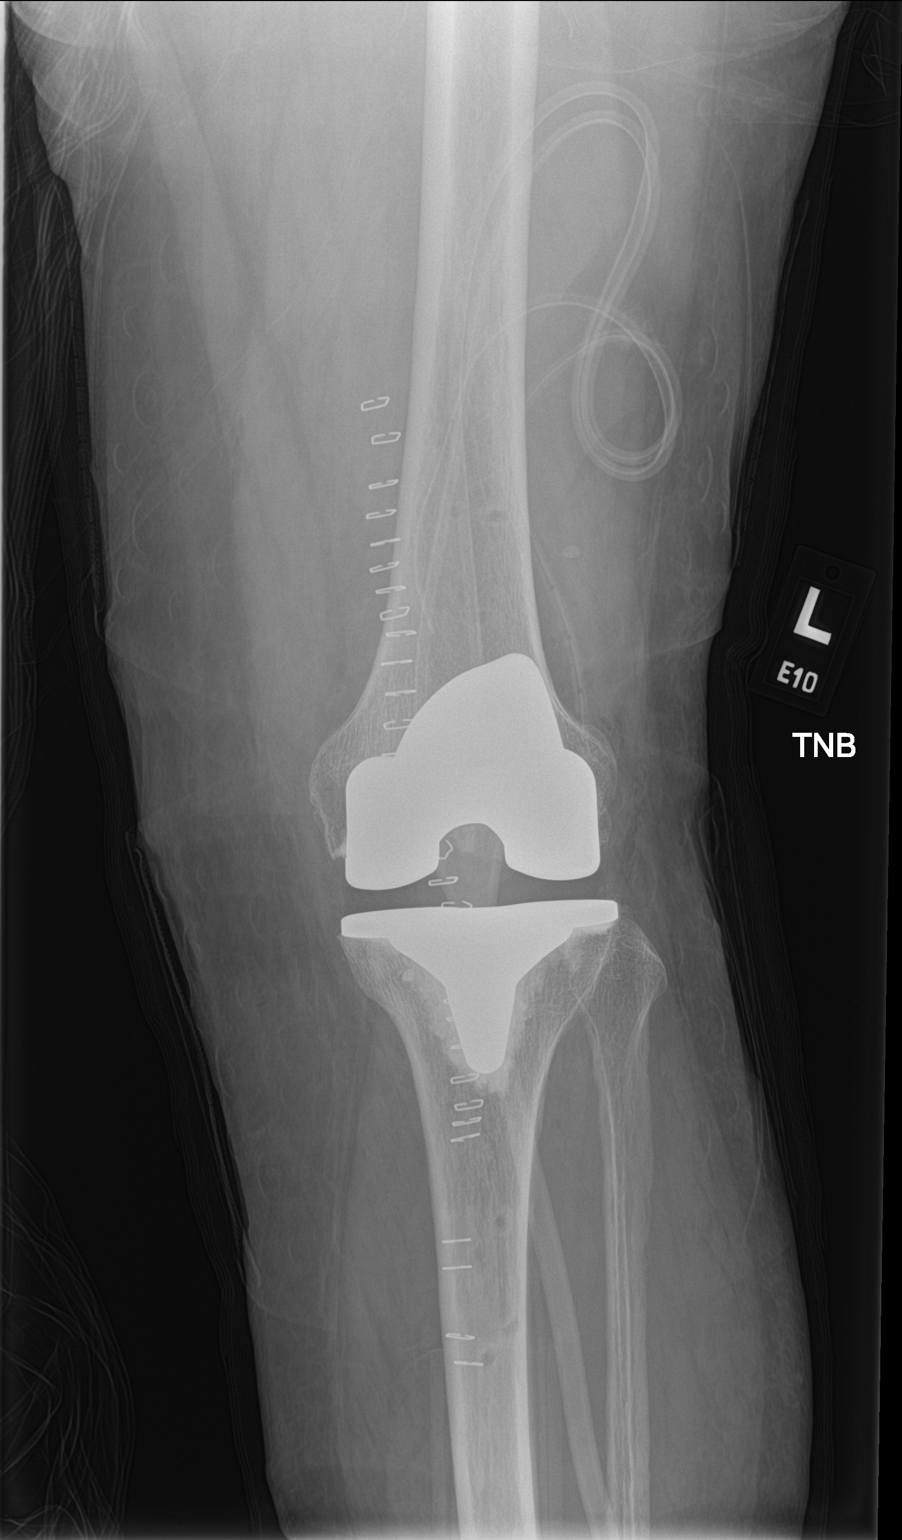

[knee lat]
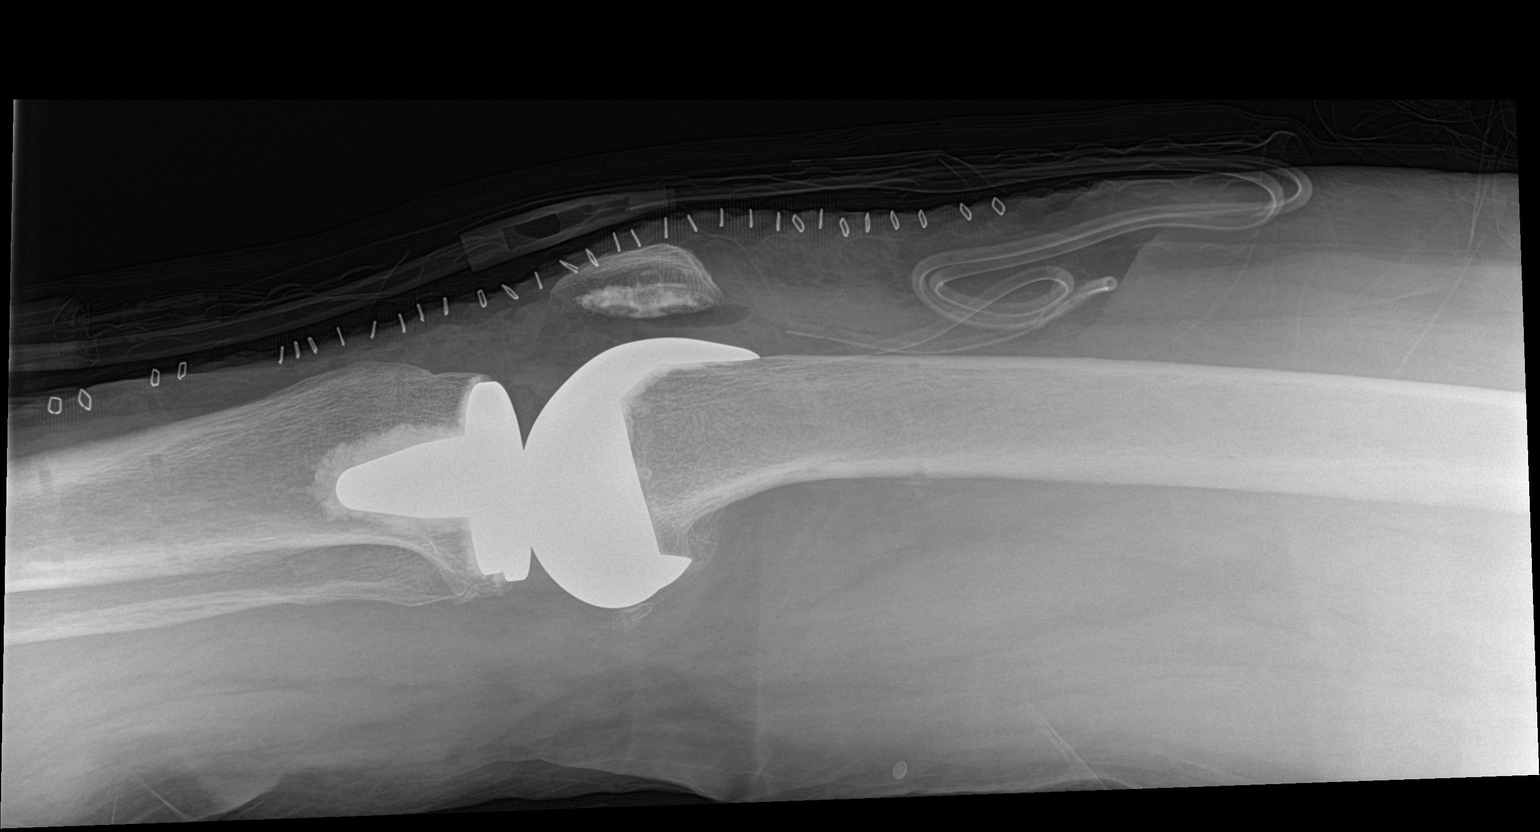

[2 of 2 positions shown; findings below may reference images not displayed]

FINDINGS: 0088 hours. Patient is status post left total knee arthroplasty. The
hardware is intact and appears well positioned. No evidence of acute
fracture or dislocation. A surgical drain is in place. There is gas
in the joint and anterior soft tissues. Anterior skin staples are
present.
IMPRESSION: No demonstrated complication following left total knee arthroplasty.
